# Patient Record
Sex: Male | Born: 1975 | Race: White | Hispanic: Yes | Marital: Married | Smoking: Never smoker
Health system: Southern US, Community
[De-identification: ages and names within clinical notes are randomized; demographics above are authoritative.]

## PROBLEM LIST (undated history)

## (undated) DIAGNOSIS — R059 Cough, unspecified: Secondary | ICD-10-CM

## (undated) DIAGNOSIS — F329 Major depressive disorder, single episode, unspecified: Secondary | ICD-10-CM

## (undated) DIAGNOSIS — R531 Weakness: Secondary | ICD-10-CM

## (undated) DIAGNOSIS — N289 Disorder of kidney and ureter, unspecified: Secondary | ICD-10-CM

## (undated) DIAGNOSIS — R55 Syncope and collapse: Secondary | ICD-10-CM

## (undated) DIAGNOSIS — F32A Depression, unspecified: Secondary | ICD-10-CM

## (undated) DIAGNOSIS — R6883 Chills (without fever): Secondary | ICD-10-CM

## (undated) DIAGNOSIS — R05 Cough: Secondary | ICD-10-CM

## (undated) DIAGNOSIS — R17 Unspecified jaundice: Secondary | ICD-10-CM

## (undated) DIAGNOSIS — I10 Essential (primary) hypertension: Secondary | ICD-10-CM

## (undated) DIAGNOSIS — E669 Obesity, unspecified: Secondary | ICD-10-CM

## (undated) DIAGNOSIS — Z5189 Encounter for other specified aftercare: Secondary | ICD-10-CM

## (undated) DIAGNOSIS — K219 Gastro-esophageal reflux disease without esophagitis: Secondary | ICD-10-CM

## (undated) DIAGNOSIS — R0602 Shortness of breath: Secondary | ICD-10-CM

## (undated) DIAGNOSIS — E569 Vitamin deficiency, unspecified: Secondary | ICD-10-CM

## (undated) DIAGNOSIS — D649 Anemia, unspecified: Secondary | ICD-10-CM

## (undated) DIAGNOSIS — E039 Hypothyroidism, unspecified: Secondary | ICD-10-CM

## (undated) DIAGNOSIS — R5383 Other fatigue: Secondary | ICD-10-CM

## (undated) DIAGNOSIS — R319 Hematuria, unspecified: Secondary | ICD-10-CM

## (undated) HISTORY — DX: Cough: R05

## (undated) HISTORY — DX: Hematuria, unspecified: R31.9

## (undated) HISTORY — DX: Weakness: R53.1

## (undated) HISTORY — DX: Depression, unspecified: F32.A

## (undated) HISTORY — DX: Anemia, unspecified: D64.9

## (undated) HISTORY — DX: Syncope and collapse: R55

## (undated) HISTORY — DX: Hypothyroidism, unspecified: E03.9

## (undated) HISTORY — DX: Other fatigue: R53.83

## (undated) HISTORY — DX: Chills (without fever): R68.83

## (undated) HISTORY — DX: Obesity, unspecified: E66.9

## (undated) HISTORY — DX: Cough, unspecified: R05.9

## (undated) HISTORY — DX: Major depressive disorder, single episode, unspecified: F32.9

## (undated) HISTORY — DX: Encounter for other specified aftercare: Z51.89

## (undated) HISTORY — DX: Essential (primary) hypertension: I10

---
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
## 2003-07-17 HISTORY — PX: CARDIAC CATHETERIZATION: SHX172

## 2006-08-28 ENCOUNTER — Ambulatory Visit: Payer: Self-pay | Admitting: Family Medicine

## 2006-08-28 LAB — CONVERTED CEMR LAB: Inflenza A Ag: NEGATIVE

## 2006-09-09 ENCOUNTER — Ambulatory Visit: Payer: Self-pay | Admitting: Family Medicine

## 2006-09-09 LAB — CONVERTED CEMR LAB: Inflenza A Ag: NEGATIVE

## 2006-11-12 ENCOUNTER — Encounter: Payer: Self-pay | Admitting: Family Medicine

## 2007-04-03 ENCOUNTER — Ambulatory Visit: Payer: Self-pay | Admitting: Family Medicine

## 2007-04-03 DIAGNOSIS — I10 Essential (primary) hypertension: Secondary | ICD-10-CM | POA: Insufficient documentation

## 2007-04-03 DIAGNOSIS — E119 Type 2 diabetes mellitus without complications: Secondary | ICD-10-CM | POA: Insufficient documentation

## 2007-06-26 ENCOUNTER — Ambulatory Visit: Payer: Self-pay | Admitting: Family Medicine

## 2007-11-03 ENCOUNTER — Encounter: Payer: Self-pay | Admitting: Family Medicine

## 2008-05-18 ENCOUNTER — Telehealth: Payer: Self-pay | Admitting: Family Medicine

## 2008-05-18 ENCOUNTER — Ambulatory Visit: Payer: Self-pay | Admitting: Family Medicine

## 2008-05-18 DIAGNOSIS — K7689 Other specified diseases of liver: Secondary | ICD-10-CM | POA: Insufficient documentation

## 2008-05-18 LAB — CONVERTED CEMR LAB
BUN: 7 mg/dL (ref 6–23)
Band Neutrophils: 4 % (ref 0–10)
Basophils Absolute: 0 10*3/uL (ref 0.0–0.1)
Basophils Relative: 0 % (ref 0–1)
Blood Glucose, Fasting: 120 mg/dL
CO2: 26 meq/L (ref 19–32)
Calcium: 9.6 mg/dL (ref 8.4–10.5)
Chloride: 107 meq/L (ref 96–112)
Creatinine, Ser: 0.9 mg/dL (ref 0.40–1.50)
Eosinophils Absolute: 0.2 10*3/uL (ref 0.0–0.7)
Eosinophils Relative: 3 % (ref 0–5)
Glucose, Bld: 129 mg/dL — ABNORMAL HIGH (ref 70–99)
HCT: 43.8 % (ref 39.0–52.0)
Hemoglobin: 14.4 g/dL (ref 13.0–17.0)
Lymphocytes Relative: 31 % (ref 12–46)
Lymphs Abs: 2 10*3/uL (ref 0.7–4.0)
MCHC: 33 g/dL (ref 30.0–36.0)
MCV: 93.3 fL (ref 78.0–100.0)
Monocytes Absolute: 0 10*3/uL — ABNORMAL LOW (ref 0.1–1.0)
Monocytes Relative: 0 % — ABNORMAL LOW (ref 3–12)
Neutro Abs: 4.4 10*3/uL (ref 1.7–7.7)
Neutrophils Relative %: 62 % (ref 43–77)
Platelets: 221 10*3/uL (ref 150–400)
Potassium: 3.9 meq/L (ref 3.5–5.3)
RBC: 4.69 M/uL (ref 4.22–5.81)
RDW: 12.4 % (ref 11.5–15.5)
Sodium: 138 meq/L (ref 135–145)
WBC: 6.6 10*3/uL (ref 4.0–10.5)

## 2008-05-19 LAB — CONVERTED CEMR LAB
ALT: 56 units/L — ABNORMAL HIGH (ref 0–53)
AST: 40 units/L — ABNORMAL HIGH (ref 0–37)
Albumin: 4.6 g/dL (ref 3.5–5.2)
Alkaline Phosphatase: 39 units/L (ref 39–117)
Amylase: 53 units/L (ref 27–131)
BUN: 8 mg/dL (ref 6–23)
CO2: 28 meq/L (ref 19–32)
Calcium: 9.8 mg/dL (ref 8.4–10.5)
Chloride: 105 meq/L (ref 96–112)
Cholesterol: 227 mg/dL — ABNORMAL HIGH (ref 0–200)
Creatinine, Ser: 1.1 mg/dL (ref 0.40–1.50)
Glucose, Bld: 125 mg/dL — ABNORMAL HIGH (ref 70–99)
HDL: 35 mg/dL — ABNORMAL LOW (ref >39)
LDL Cholesterol: 162 mg/dL — ABNORMAL HIGH (ref 0–99)
Lipase: 15 units/L (ref 11–59)
Potassium: 3.9 meq/L (ref 3.5–5.3)
Sodium: 140 meq/L (ref 135–145)
TSH: 3.796 microintl units/mL (ref 0.350–4.50)
Total Bilirubin: 1.3 mg/dL — ABNORMAL HIGH (ref 0.3–1.2)
Total CHOL/HDL Ratio: 6.5
Total Protein: 7.5 g/dL (ref 6.0–8.3)
Triglycerides: 151 mg/dL — ABNORMAL HIGH (ref <150)
VLDL: 30 mg/dL (ref 0–40)

## 2008-08-24 ENCOUNTER — Encounter: Payer: Self-pay | Admitting: Family Medicine

## 2009-08-10 ENCOUNTER — Ambulatory Visit: Payer: Self-pay | Admitting: Family Medicine

## 2009-08-10 LAB — CONVERTED CEMR LAB
Bilirubin Urine: NEGATIVE
Blood in Urine, dipstick: NEGATIVE
Creatinine,U: 300 mg/dL
Glucose, Urine, Semiquant: NEGATIVE
Microalbumin U total vol: 30 mg/L
Nitrite: NEGATIVE
Specific Gravity, Urine: 1.025
Urobilinogen, UA: 0.2
WBC Urine, dipstick: NEGATIVE
pH: 5.5

## 2009-08-11 LAB — CONVERTED CEMR LAB
ALT: 39 units/L (ref 0–53)
AST: 28 units/L (ref 0–37)
Albumin: 5.1 g/dL (ref 3.5–5.2)
Alkaline Phosphatase: 54 units/L (ref 39–117)
BUN: 18 mg/dL (ref 6–23)
CO2: 24 meq/L (ref 19–32)
Calcium: 9.9 mg/dL (ref 8.4–10.5)
Chloride: 102 meq/L (ref 96–112)
Creatinine, Ser: 0.81 mg/dL (ref 0.40–1.50)
Glucose, Bld: 120 mg/dL — ABNORMAL HIGH (ref 70–99)
Hgb A1c MFr Bld: 6.8 % — ABNORMAL HIGH (ref 4.6–6.1)
Potassium: 4.1 meq/L (ref 3.5–5.3)
Sodium: 139 meq/L (ref 135–145)
Total Bilirubin: 1 mg/dL (ref 0.3–1.2)
Total Protein: 8.1 g/dL (ref 6.0–8.3)

## 2009-11-28 ENCOUNTER — Ambulatory Visit: Payer: Self-pay | Admitting: Family Medicine

## 2009-11-28 DIAGNOSIS — F3289 Other specified depressive episodes: Secondary | ICD-10-CM | POA: Insufficient documentation

## 2009-11-28 DIAGNOSIS — E039 Hypothyroidism, unspecified: Secondary | ICD-10-CM | POA: Insufficient documentation

## 2009-11-28 DIAGNOSIS — F329 Major depressive disorder, single episode, unspecified: Secondary | ICD-10-CM

## 2009-11-28 DIAGNOSIS — E669 Obesity, unspecified: Secondary | ICD-10-CM | POA: Insufficient documentation

## 2009-11-28 DIAGNOSIS — L738 Other specified follicular disorders: Secondary | ICD-10-CM | POA: Insufficient documentation

## 2009-11-28 DIAGNOSIS — E038 Other specified hypothyroidism: Secondary | ICD-10-CM | POA: Insufficient documentation

## 2009-11-29 ENCOUNTER — Encounter: Payer: Self-pay | Admitting: Family Medicine

## 2009-11-29 LAB — CONVERTED CEMR LAB
Free T4: 0.98 ng/dL (ref 0.80–1.80)
Glucose, Bld: 177 mg/dL — ABNORMAL HIGH (ref 70–99)
T3, Free: 3.2 pg/mL (ref 2.3–4.2)
TSH: 10.366 microintl units/mL — ABNORMAL HIGH (ref 0.350–4.500)

## 2009-11-30 ENCOUNTER — Encounter: Admission: RE | Admit: 2009-11-30 | Discharge: 2009-11-30 | Payer: Self-pay | Admitting: Family Medicine

## 2009-11-30 DIAGNOSIS — E041 Nontoxic single thyroid nodule: Secondary | ICD-10-CM | POA: Insufficient documentation

## 2009-12-01 ENCOUNTER — Telehealth: Payer: Self-pay | Admitting: Family Medicine

## 2009-12-01 ENCOUNTER — Encounter: Payer: Self-pay | Admitting: Family Medicine

## 2009-12-07 ENCOUNTER — Encounter: Admission: RE | Admit: 2009-12-07 | Discharge: 2009-12-07 | Payer: Self-pay | Admitting: Family Medicine

## 2009-12-07 ENCOUNTER — Encounter: Payer: Self-pay | Admitting: Family Medicine

## 2009-12-07 ENCOUNTER — Other Ambulatory Visit: Admission: RE | Admit: 2009-12-07 | Discharge: 2009-12-07 | Payer: Self-pay | Admitting: Interventional Radiology

## 2009-12-09 ENCOUNTER — Telehealth: Payer: Self-pay | Admitting: Family Medicine

## 2009-12-20 ENCOUNTER — Telehealth: Payer: Self-pay | Admitting: Family Medicine

## 2010-01-06 ENCOUNTER — Ambulatory Visit: Payer: Self-pay | Admitting: Family Medicine

## 2010-01-06 LAB — CONVERTED CEMR LAB: Blood Glucose, Fingerstick: 135

## 2010-01-10 ENCOUNTER — Ambulatory Visit: Payer: Self-pay | Admitting: Family Medicine

## 2010-01-12 ENCOUNTER — Ambulatory Visit: Payer: Self-pay | Admitting: Family Medicine

## 2010-01-12 LAB — CONVERTED CEMR LAB
Bilirubin Urine: NEGATIVE
Blood in Urine, dipstick: NEGATIVE
Glucose, Urine, Semiquant: NEGATIVE
Ketones, urine, test strip: NEGATIVE
Nitrite: NEGATIVE
Protein, U semiquant: NEGATIVE
Specific Gravity, Urine: 1.025
Urobilinogen, UA: 0.2
WBC Urine, dipstick: NEGATIVE
pH: 5.5

## 2010-03-13 ENCOUNTER — Telehealth: Payer: Self-pay | Admitting: Family Medicine

## 2010-03-15 ENCOUNTER — Ambulatory Visit: Payer: Self-pay | Admitting: Family Medicine

## 2010-03-15 ENCOUNTER — Encounter: Admission: RE | Admit: 2010-03-15 | Discharge: 2010-03-15 | Payer: Self-pay | Admitting: Family Medicine

## 2010-03-15 LAB — CONVERTED CEMR LAB: Hgb A1c MFr Bld: 8.1 %

## 2010-03-23 ENCOUNTER — Ambulatory Visit: Payer: Self-pay | Admitting: Family Medicine

## 2010-04-06 ENCOUNTER — Encounter: Payer: Self-pay | Admitting: Family Medicine

## 2010-04-17 ENCOUNTER — Ambulatory Visit: Payer: Self-pay | Admitting: Family Medicine

## 2010-04-25 ENCOUNTER — Telehealth: Payer: Self-pay | Admitting: Family Medicine

## 2010-04-26 ENCOUNTER — Telehealth: Payer: Self-pay | Admitting: Family Medicine

## 2010-04-27 ENCOUNTER — Ambulatory Visit: Payer: Self-pay | Admitting: Family Medicine

## 2010-04-27 LAB — CONVERTED CEMR LAB
BUN: 16 mg/dL (ref 6–23)
Blood Glucose, AC Bkfst: 192 mg/dL
CO2: 26 meq/L (ref 19–32)
Calcium: 9.7 mg/dL (ref 8.4–10.5)
Chloride: 105 meq/L (ref 96–112)
Creatinine, Ser: 0.9 mg/dL (ref 0.40–1.50)
Glucose, Bld: 191 mg/dL — ABNORMAL HIGH (ref 70–99)
Potassium: 4 meq/L (ref 3.5–5.3)
Sodium: 135 meq/L (ref 135–145)

## 2010-05-12 ENCOUNTER — Encounter: Payer: Self-pay | Admitting: Family Medicine

## 2010-05-15 ENCOUNTER — Ambulatory Visit (HOSPITAL_COMMUNITY): Admission: RE | Admit: 2010-05-15 | Discharge: 2010-05-15 | Payer: Self-pay | Admitting: General Surgery

## 2010-05-15 LAB — HM DIABETES EYE EXAM

## 2010-05-18 ENCOUNTER — Encounter (INDEPENDENT_AMBULATORY_CARE_PROVIDER_SITE_OTHER): Payer: Self-pay | Admitting: *Deleted

## 2010-05-25 ENCOUNTER — Encounter
Admission: RE | Admit: 2010-05-25 | Discharge: 2010-08-15 | Payer: Self-pay | Source: Home / Self Care | Attending: General Surgery | Admitting: General Surgery

## 2010-07-16 HISTORY — PX: ROUX-EN-Y GASTRIC BYPASS: SHX1104

## 2010-07-24 ENCOUNTER — Inpatient Hospital Stay (HOSPITAL_COMMUNITY)
Admission: RE | Admit: 2010-07-24 | Discharge: 2010-07-30 | Payer: Self-pay | Source: Home / Self Care | Attending: General Surgery | Admitting: General Surgery

## 2010-07-25 ENCOUNTER — Encounter (INDEPENDENT_AMBULATORY_CARE_PROVIDER_SITE_OTHER): Payer: Self-pay | Admitting: General Surgery

## 2010-07-31 LAB — CROSSMATCH
ABO/RH(D): O POS
Antibody Screen: NEGATIVE
Unit division: 0
Unit division: 0
Unit division: 0
Unit division: 0
Unit division: 0
Unit division: 0
Unit division: 0
Unit division: 0

## 2010-07-31 LAB — CBC
HCT: 35.8 % — ABNORMAL LOW (ref 39.0–52.0)
HCT: 26.3 % — ABNORMAL LOW (ref 39.0–52.0)
HCT: 26.9 % — ABNORMAL LOW (ref 39.0–52.0)
HCT: 30 % — ABNORMAL LOW (ref 39.0–52.0)
HCT: 26.5 % — ABNORMAL LOW (ref 39.0–52.0)
HCT: 27 % — ABNORMAL LOW (ref 39.0–52.0)
HCT: 29 % — ABNORMAL LOW (ref 39.0–52.0)
Hemoglobin: 11.5 g/dL — ABNORMAL LOW (ref 13.0–17.0)
Hemoglobin: 8.8 g/dL — ABNORMAL LOW (ref 13.0–17.0)
Hemoglobin: 8.8 g/dL — ABNORMAL LOW (ref 13.0–17.0)
Hemoglobin: 10 g/dL — ABNORMAL LOW (ref 13.0–17.0)
Hemoglobin: 8.8 g/dL — ABNORMAL LOW (ref 13.0–17.0)
Hemoglobin: 8.9 g/dL — ABNORMAL LOW (ref 13.0–17.0)
Hemoglobin: 9.6 g/dL — ABNORMAL LOW (ref 13.0–17.0)
MCH: 30.4 pg (ref 26.0–34.0)
MCH: 29.9 pg (ref 26.0–34.0)
MCH: 29.6 pg (ref 26.0–34.0)
MCH: 29.9 pg (ref 26.0–34.0)
MCH: 29.9 pg (ref 26.0–34.0)
MCH: 29.7 pg (ref 26.0–34.0)
MCH: 29.8 pg (ref 26.0–34.0)
MCHC: 32.1 g/dL (ref 30.0–36.0)
MCHC: 33.5 g/dL (ref 30.0–36.0)
MCHC: 32.7 g/dL (ref 30.0–36.0)
MCHC: 33.3 g/dL (ref 30.0–36.0)
MCHC: 33.2 g/dL (ref 30.0–36.0)
MCHC: 33 g/dL (ref 30.0–36.0)
MCHC: 33.1 g/dL (ref 30.0–36.0)
MCV: 94.7 fL (ref 78.0–100.0)
MCV: 89.5 fL (ref 78.0–100.0)
MCV: 90.6 fL (ref 78.0–100.0)
MCV: 89.6 fL (ref 78.0–100.0)
MCV: 90.1 fL (ref 78.0–100.0)
MCV: 90 fL (ref 78.0–100.0)
MCV: 90.1 fL (ref 78.0–100.0)
Platelets: 144 10*3/uL — ABNORMAL LOW (ref 150–400)
Platelets: 85 10*3/uL — ABNORMAL LOW (ref 150–400)
Platelets: 76 10*3/uL — ABNORMAL LOW (ref 150–400)
Platelets: 119 10*3/uL — ABNORMAL LOW (ref 150–400)
Platelets: 119 10*3/uL — ABNORMAL LOW (ref 150–400)
Platelets: 171 10*3/uL (ref 150–400)
Platelets: 233 10*3/uL (ref 150–400)
RBC: 3.78 MIL/uL — ABNORMAL LOW (ref 4.22–5.81)
RBC: 2.94 MIL/uL — ABNORMAL LOW (ref 4.22–5.81)
RBC: 2.97 MIL/uL — ABNORMAL LOW (ref 4.22–5.81)
RBC: 3.35 MIL/uL — ABNORMAL LOW (ref 4.22–5.81)
RBC: 2.94 MIL/uL — ABNORMAL LOW (ref 4.22–5.81)
RBC: 3 MIL/uL — ABNORMAL LOW (ref 4.22–5.81)
RBC: 3.22 MIL/uL — ABNORMAL LOW (ref 4.22–5.81)
RDW: 12.7 % (ref 11.5–15.5)
RDW: 15.3 % (ref 11.5–15.5)
RDW: 15.1 % (ref 11.5–15.5)
RDW: 14.9 % (ref 11.5–15.5)
RDW: 14.9 % (ref 11.5–15.5)
RDW: 14.8 % (ref 11.5–15.5)
RDW: 14.9 % (ref 11.5–15.5)
WBC: 9.1 10*3/uL (ref 4.0–10.5)
WBC: 10.2 10*3/uL (ref 4.0–10.5)
WBC: 9.8 10*3/uL (ref 4.0–10.5)
WBC: 10.2 10*3/uL (ref 4.0–10.5)
WBC: 7.1 10*3/uL (ref 4.0–10.5)
WBC: 6.1 10*3/uL (ref 4.0–10.5)
WBC: 7 10*3/uL (ref 4.0–10.5)

## 2010-07-31 LAB — HEMOGLOBIN AND HEMATOCRIT, BLOOD
HCT: 43.7 % (ref 39.0–52.0)
HCT: 31.5 % — ABNORMAL LOW (ref 39.0–52.0)
HCT: 27 % — ABNORMAL LOW (ref 39.0–52.0)
HCT: 26.7 % — ABNORMAL LOW (ref 39.0–52.0)
HCT: 26 % — ABNORMAL LOW (ref 39.0–52.0)
HCT: 24.9 % — ABNORMAL LOW (ref 39.0–52.0)
HCT: 27.9 % — ABNORMAL LOW (ref 39.0–52.0)
HCT: 27.6 % — ABNORMAL LOW (ref 39.0–52.0)
Hemoglobin: 14.6 g/dL (ref 13.0–17.0)
Hemoglobin: 10.1 g/dL — ABNORMAL LOW (ref 13.0–17.0)
Hemoglobin: 8.6 g/dL — ABNORMAL LOW (ref 13.0–17.0)
Hemoglobin: 8.6 g/dL — ABNORMAL LOW (ref 13.0–17.0)
Hemoglobin: 8.5 g/dL — ABNORMAL LOW (ref 13.0–17.0)
Hemoglobin: 8.3 g/dL — ABNORMAL LOW (ref 13.0–17.0)
Hemoglobin: 9.3 g/dL — ABNORMAL LOW (ref 13.0–17.0)
Hemoglobin: 9.3 g/dL — ABNORMAL LOW (ref 13.0–17.0)

## 2010-07-31 LAB — GLUCOSE, CAPILLARY
Glucose-Capillary: 178 mg/dL — ABNORMAL HIGH (ref 70–99)
Glucose-Capillary: 191 mg/dL — ABNORMAL HIGH (ref 70–99)
Glucose-Capillary: 204 mg/dL — ABNORMAL HIGH (ref 70–99)
Glucose-Capillary: 221 mg/dL — ABNORMAL HIGH (ref 70–99)
Glucose-Capillary: 235 mg/dL — ABNORMAL HIGH (ref 70–99)
Glucose-Capillary: 252 mg/dL — ABNORMAL HIGH (ref 70–99)
Glucose-Capillary: 263 mg/dL — ABNORMAL HIGH (ref 70–99)
Glucose-Capillary: 117 mg/dL — ABNORMAL HIGH (ref 70–99)
Glucose-Capillary: 118 mg/dL — ABNORMAL HIGH (ref 70–99)
Glucose-Capillary: 119 mg/dL — ABNORMAL HIGH (ref 70–99)
Glucose-Capillary: 124 mg/dL — ABNORMAL HIGH (ref 70–99)
Glucose-Capillary: 127 mg/dL — ABNORMAL HIGH (ref 70–99)
Glucose-Capillary: 129 mg/dL — ABNORMAL HIGH (ref 70–99)
Glucose-Capillary: 138 mg/dL — ABNORMAL HIGH (ref 70–99)
Glucose-Capillary: 151 mg/dL — ABNORMAL HIGH (ref 70–99)
Glucose-Capillary: 168 mg/dL — ABNORMAL HIGH (ref 70–99)
Glucose-Capillary: 179 mg/dL — ABNORMAL HIGH (ref 70–99)
Glucose-Capillary: 219 mg/dL — ABNORMAL HIGH (ref 70–99)
Glucose-Capillary: 227 mg/dL — ABNORMAL HIGH (ref 70–99)
Glucose-Capillary: 100 mg/dL — ABNORMAL HIGH (ref 70–99)
Glucose-Capillary: 100 mg/dL — ABNORMAL HIGH (ref 70–99)
Glucose-Capillary: 115 mg/dL — ABNORMAL HIGH (ref 70–99)
Glucose-Capillary: 124 mg/dL — ABNORMAL HIGH (ref 70–99)
Glucose-Capillary: 128 mg/dL — ABNORMAL HIGH (ref 70–99)
Glucose-Capillary: 129 mg/dL — ABNORMAL HIGH (ref 70–99)
Glucose-Capillary: 138 mg/dL — ABNORMAL HIGH (ref 70–99)
Glucose-Capillary: 139 mg/dL — ABNORMAL HIGH (ref 70–99)
Glucose-Capillary: 140 mg/dL — ABNORMAL HIGH (ref 70–99)
Glucose-Capillary: 198 mg/dL — ABNORMAL HIGH (ref 70–99)
Glucose-Capillary: 211 mg/dL — ABNORMAL HIGH (ref 70–99)
Glucose-Capillary: 286 mg/dL — ABNORMAL HIGH (ref 70–99)
Glucose-Capillary: 90 mg/dL (ref 70–99)
Glucose-Capillary: 97 mg/dL (ref 70–99)
Glucose-Capillary: 99 mg/dL (ref 70–99)
Glucose-Capillary: 102 mg/dL — ABNORMAL HIGH (ref 70–99)
Glucose-Capillary: 106 mg/dL — ABNORMAL HIGH (ref 70–99)
Glucose-Capillary: 120 mg/dL — ABNORMAL HIGH (ref 70–99)

## 2010-07-31 LAB — BASIC METABOLIC PANEL
BUN: 11 mg/dL (ref 6–23)
CO2: 29 mEq/L (ref 19–32)
Calcium: 7.9 mg/dL — ABNORMAL LOW (ref 8.4–10.5)
Chloride: 107 mEq/L (ref 96–112)
Creatinine, Ser: 0.98 mg/dL (ref 0.4–1.5)
GFR calc Af Amer: >60 mL/min (ref >60)
GFR calc non Af Amer: >60 mL/min (ref >60)
Glucose, Bld: 200 mg/dL — ABNORMAL HIGH (ref 70–99)
Potassium: 4.1 mEq/L (ref 3.5–5.1)
Sodium: 140 mEq/L (ref 135–145)

## 2010-07-31 LAB — PROTIME-INR
INR: 1.17 (ref 0.00–1.49)
Prothrombin Time: 15.1 seconds (ref 11.6–15.2)

## 2010-07-31 LAB — DIFFERENTIAL
Basophils Absolute: 0 10*3/uL (ref 0.0–0.1)
Basophils Absolute: 0 10*3/uL (ref 0.0–0.1)
Basophils Relative: 0 % (ref 0–1)
Basophils Relative: 0 % (ref 0–1)
Eosinophils Absolute: 0 10*3/uL (ref 0.0–0.7)
Eosinophils Absolute: 0 10*3/uL (ref 0.0–0.7)
Eosinophils Relative: 0 % (ref 0–5)
Eosinophils Relative: 0 % (ref 0–5)
Lymphocytes Relative: 14 % (ref 12–46)
Lymphocytes Relative: 18 % (ref 12–46)
Lymphs Abs: 1.3 10*3/uL (ref 0.7–4.0)
Lymphs Abs: 1.9 10*3/uL (ref 0.7–4.0)
Monocytes Absolute: 0.7 10*3/uL (ref 0.1–1.0)
Monocytes Absolute: 1.1 10*3/uL — ABNORMAL HIGH (ref 0.1–1.0)
Monocytes Relative: 8 % (ref 3–12)
Monocytes Relative: 11 % (ref 3–12)
Neutro Abs: 7.1 10*3/uL (ref 1.7–7.7)
Neutro Abs: 7.2 10*3/uL (ref 1.7–7.7)
Neutrophils Relative %: 78 % — ABNORMAL HIGH (ref 43–77)
Neutrophils Relative %: 71 % (ref 43–77)

## 2010-07-31 LAB — PREPARE PLATELETS: Unit division: 0

## 2010-07-31 LAB — ABO/RH: ABO/RH(D): O POS

## 2010-07-31 LAB — PREPARE FRESH FROZEN PLASMA: Unit division: 0

## 2010-07-31 LAB — PREPARE RBC (CROSSMATCH)

## 2010-07-31 LAB — APTT: aPTT: 27 seconds (ref 24–37)

## 2010-08-02 ENCOUNTER — Ambulatory Visit
Admission: RE | Admit: 2010-08-02 | Discharge: 2010-08-02 | Payer: Self-pay | Source: Home / Self Care | Attending: Family Medicine | Admitting: Family Medicine

## 2010-08-02 DIAGNOSIS — Z9884 Bariatric surgery status: Secondary | ICD-10-CM | POA: Insufficient documentation

## 2010-08-02 DIAGNOSIS — D649 Anemia, unspecified: Secondary | ICD-10-CM | POA: Insufficient documentation

## 2010-08-02 LAB — CONVERTED CEMR LAB
Blood Glucose, Fingerstick: 131
WBC: 11 10*3/uL

## 2010-08-03 ENCOUNTER — Encounter: Payer: Self-pay | Admitting: Family Medicine

## 2010-08-03 LAB — CONVERTED CEMR LAB
AST: 60 units/L — ABNORMAL HIGH (ref 0–37)
Alkaline Phosphatase: 61 units/L (ref 39–117)
BUN: 11 mg/dL (ref 6–23)
Basophils Relative: 0 % (ref 0–1)
Calcium: 9.6 mg/dL (ref 8.4–10.5)
Chloride: 103 meq/L (ref 96–112)
Creatinine, Ser: 0.85 mg/dL (ref 0.40–1.50)
Eosinophils Absolute: 0.2 10*3/uL (ref 0.0–0.7)
Ferritin: 247 ng/mL (ref 22–322)
Glucose, Bld: 113 mg/dL — ABNORMAL HIGH (ref 70–99)
Hemoglobin: 11.3 g/dL — ABNORMAL LOW (ref 13.0–17.0)
MCHC: 33 g/dL (ref 30.0–36.0)
MCV: 91.2 fL (ref 78.0–100.0)
Monocytes Absolute: 0.5 10*3/uL (ref 0.1–1.0)
Monocytes Relative: 8 % (ref 3–12)
RBC: 3.75 M/uL — ABNORMAL LOW (ref 4.22–5.81)

## 2010-08-04 NOTE — Discharge Summary (Signed)
Casey Fletcher          ACCOUNT NO.:  [id number]  MEDICAL RECORD NO.:  [id number]          PATIENT TYPE:  INP  LOCATION:  1539                         FACILITY:  [facility]  PHYSICIAN:  Sharlet Salina T. Willard Madrigal, M.D.DATE OF BIRTH:  10-Aug-1975  DATE OF ADMISSION:  07/24/2010 DATE OF DISCHARGE:  07/30/2010                              DISCHARGE SUMMARY   DISCHARGE DIAGNOSES: 1. Morbid obesity. 2. Postoperative gastrointestinal bleed.  OPERATIONS AND PROCEDURES:  Laparoscopic Roux-en-Y gastric bypass, Dr. Johna Sheriff, on July 24, 2010.  HISTORY OF PRESENT ILLNESS:  Casey Fletcher is a 35 year old male with longstanding history of progressive morbid obesity, unresponsive to medical management.  He presents at a weight of 370 pounds, BMI of 45 and comorbidities of poorly-controlled insulin-dependent diabetes mellitus, hypertension, elevated cholesterol.  After extensive preoperative workup and discussion as an outpatient, he is admitted electively for laparoscopic Roux-en-Y gastric bypass.  PAST MEDICAL HISTORY:  Other surgery includes appendectomy, negative percutaneous biopsy of thyroid.  MEDICATIONS ON ADMISSION:  Lantus 20 daily, metformin 1000 twice daily, Synthroid 0.05 daily, ramipril 2.5 daily.  ALLERGIES:  None.  SOCIAL HISTORY:  See detailed H and P.  FAMILY HISTORY:  See detailed H and P.  REVIEW OF SYSTEMS:  See detailed H and P.  PERTINENT PHYSICAL EXAMINATION:  Significant only for morbid obesity.  HOSPITAL COURSE:  The patient was admitted on the morning of this procedure and underwent an unremarkable laparoscopic Roux-en-Y gastric bypass.  On the first morning postoperatively, however, he had some mild tachycardia of about 100 and when he went for his barium swallow, was dizzy and lightheaded and this had to be cancelled.  Immediately after that, he had a bloody bowel movement, heart rate was 117 and hemoglobin was 11.5 down from 14.1 preop.  At this  point, the patient was transferred to the step-down unit for observation and his Lovenox was held.  Later that evening, he had one further large bloody bowel movement and heart rate went up to 140s briefly, blood pressure was somewhat low at 106/60.  This improved with fluid bolus with heart rate back down to 120s but hemoglobin throughout the day went 10.1 and then 8.6.  He was transfused 4 units of packed cells overnight on July 25, 2010, and July 26, 2010, with stabilization of his hemoglobin at 8.8 but no rise.  His vital signs remained relatively stable with normal blood pressure and heart rate of about 106.  He then received fresh frozen plasma and platelets subsequently.  On July 27, 2010, he was feeling well, heart rate was down to 95 but hemoglobin dropped somewhat to 8.3.  He received subsequently 2 more units of packed cells.  By July 28, 2010, he was feeling much better, heart rate was down to 95, hemoglobin which had gone up to 10 after the 6 units was down to 8.8 but there was no evidence of bleeding or vital sign instability and he was observed.  On July 29, 2010, his hemoglobin was stable at 8.9.  He felt well.  He was tolerating protein shakes.  He never had any abdominal pain.  By July 30, 2010, he had normal bowel  movement without blood.  Hemoglobin was increased to 9.6.  White count was 7000, and he was nontender.  Wound was clean.  His heart rate was 85, blood pressure 132/70.  At this point, he was doing well and was discharged home.  His staples were removed.  DISCHARGE MEDICATIONS:  Same as admission, although he is on a reduced dose of insulin.  FOLLOWUP:  Followup is to be in my office in 10 days.     Casey Fletcher, M.D.     Casey Fletcher  D:  07/30/2010  T:  07/30/2010  Job:  [id number]  Electronically Signed by Glenna Fellows M.D. on 08/04/2010 09:15:21 AM

## 2010-08-08 ENCOUNTER — Encounter
Admission: RE | Admit: 2010-08-08 | Discharge: 2010-08-15 | Payer: Self-pay | Source: Home / Self Care | Attending: General Surgery | Admitting: General Surgery

## 2010-08-10 ENCOUNTER — Encounter: Payer: Self-pay | Admitting: Family Medicine

## 2010-08-15 NOTE — Letter (Signed)
Summary: Depression Questionnaire  Depression Questionnaire   Imported By: Lanelle Bal 12/05/2009 11:30:59  _____________________________________________________________________  External Attachment:    Type:   Image     Comment:   External Document

## 2010-08-15 NOTE — Progress Notes (Signed)
Summary: Work note  Phone Note Call from Patient   Caller: [phone] Summary of Call: Pt called stating he is still not feeling well. He is at work right now but is feeling very run down and tired. pt would like to know if he goes home, will you write him a note. Please advise. Initial call taken by: Payton Spark CMA,  Dec 01, 2009 9:57 AM  Follow-up for Phone Call        yes, will write him out today. Follow-up by: Seymour Bars DO,  Dec 01, 2009 10:24 AM     Appended Document: Work note 12/01/2009 @ 10:31am- Pt notified and faxed note to his work at [phone]. KJ LPN

## 2010-08-15 NOTE — Assessment & Plan Note (Signed)
Summary: DM, HTN   Vital Signs:  Patient profile:   35 year old male Height:      75.5 inches Weight:      355 pounds BMI:     43.94 Pulse rate:   101 / minute BP sitting:   158 / 102  (left arm) Cuff size:   large  Vitals Entered By: Kathlene November (August 10, 2009 9:56 AM) CC: physical and papers filled out, Hypertension Management  Vision Screening:Left eye with correction: 20 / 20 Right eye with correction: 20 / 20 Both eyes with correction: 20 / 20  Color vision testing: normal      Vision Entered By: Kathlene November (August 10, 2009 9:57 AM) 25db HL: Left  500 hz: 25db 1000 hz: 25db 2000 hz: 25db 4000 hz: 25db Right  500 hz: 25db 1000 hz: 25db 2000 hz: 25db 4000 hz: 25db    Primary Care Provider:  Nani Gasser MD  CC:  physical and papers filled out and Hypertension Management.  History of Present Illness: Had lost 150lb and his endocrinologist took him off his meds but now has gained about 70 lbs back. Admits he is a stress eater.  Say he has started back on his weight loss plan and regular exercise. He is also on Adipex through a bariatric clinic. 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 He brought hsi forms in with him.   Diabetes Management History:      The patient is a 35 years old male who comes in for evaluation of DM Type 2.  He says that he is not exercising regularly.        Hypoglycemic symptoms are not occurring.  No hyperglycemic symptoms are reported.  Other comments include: Has not bee checking his sugars. .        There are no symptoms to suggest diabetic complications.  No changes have been made to his treatment plan since last visit.    Hypertension History:      He denies headache, chest pain, palpitations, dyspnea with exertion, orthopnea, PND, peripheral edema, visual symptoms, neurologic problems, syncope, and side effects from treatment.  He notes no problems with any antihypertensive medication  side effects.        Positive major cardiovascular risk factors include diabetes and hypertension.  Negative major cardiovascular risk factors include male age less than 33 years old and non-tobacco-user status.     Current Medications (verified): 1)  Adipex-P 37.5 Mg Tabs (Phentermine Hcl) .... Take 1 1/2 Tablets By Mouth Once A Day  Allergies (verified): No Known Drug Allergies  Comments:  Nurse/Medical Assistant: The patient's medications and allergies were reviewed with the patient and were updated in the Medication and Allergy Lists. Kathlene November (August 10, 2009 9:58 AM)  Physical Exam  General:  Well-developed,well-nourished,in no acute distress; alert,appropriate and cooperative throughout examination. Obese.  Head:  Normocephalic and atraumatic without obvious abnormalities. No apparent alopecia or balding. Lungs:  Normal respiratory effort, chest expands symmetrically. Lungs are clear to auscultation, no crackles or wheezes. Heart:  Normal rate and regular rhythm. S1 and S2 normal without gallop, murmur, click, rub or other extra sounds. Pulses:  Radial 2+  Extremities:  No LE edmea.  Skin:  no rashes.   Psych:  Cognition and judgment appear intact. Alert and cooperative with normal attention span and concentration. No apparent delusions, illusions, hallucinations   Impression & Recommendations:  Problem # 1:  DIABETES MELLITUS, TYPE II,  CONTROLLED (ICD-250.00) Assessment Deteriorated Discussed that since our maching wouldn't read the A1C will get a serum test to confirm. He is not spilling glucose in his urine which is a good sign. 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 So I gave him his forms back and we didn't complete these.  He really wants to work on his weight and then fu in 6 weeks to recheck his Diabetes and BP. Neg microalbumin today.  The following medications were removed from  the medication list:    Janumet 50-1000 Mg Tabs (Sitagliptin-metformin hcl) ..... One by mouth two times a day  Orders: Fingerstick (36416) Hgb A1C (29562ZH) T-Comprehensive Metabolic Panel (08657-84696) T- * Misc. Laboratory test [phone]) UA Dipstick w/o Micro (automated)  (81003) Urine Microalbumin (41324) Creatinine  (82570)  Problem # 2:  HYPERTENSION, BENIGN ESSENTIAL (ICD-401.1) Assessment: Deteriorated Discussed starting a BP medication for now and at fu in 6 weeks if has lost weight and BP looks good then consider d/c again.   His updated medication list for this problem includes:    Hydrochlorothiazide 25 Mg Tabs (Hydrochlorothiazide) .Marland Kitchen... Take 1 tablet by mouth once a day  Complete Medication List: 1)  Hydrochlorothiazide 25 Mg Tabs (Hydrochlorothiazide) .... Take 1 tablet by mouth once a day 2)  Adipex-p 37.5 Mg Tabs (Phentermine hcl) .... Take 1 1/2 tablets by mouth once a day  Diabetes Management Assessment/Plan:      His blood pressure goal is < 130/80.    Hypertension Assessment/Plan:      The patient's hypertensive risk group is category C: Target organ damage and/or diabetes.  His calculated 10 year risk of coronary heart disease is 18 %.  Today's blood pressure is 158/102.  His blood pressure goal is < 130/80.  Patient Instructions: 1)  Please schedule a follow-up appointment in 6 weeks. Work on weight loss. 2)  Restart BP pill and we will call you with your diabetes check tomorrow and let you know if we need to restart a diabetic med.  Prescriptions: HYDROCHLOROTHIAZIDE 25 MG  TABS (HYDROCHLOROTHIAZIDE) Take 1 tablet by mouth once a day  #30 x 2   Entered and Authorized by:   Nani Gasser MD   Signed by:   Nani Gasser MD on 08/10/2009   Method used:   Electronically to        [facility] [phone]* (retail)       [address]       [place], [place]  [address]       Ph: [phone]       Fax: [phone]   RxID:    [id number]   Laboratory Results   Urine Tests  Date/Time Received: 08/10/2009 Date/Time Reported: 08/10/2009  Routine Urinalysis   Color: yellow Appearance: Clear Glucose: negative   (Normal Range: Negative) Bilirubin: negative   (Normal Range: Negative) Ketone: trace (5)   (Normal Range: Negative) Spec. Gravity: 1.025   (Normal Range: 1.003-1.035) Blood: negative   (Normal Range: Negative) pH: 5.5   (Normal Range: 5.0-8.0) Protein: trace   (Normal Range: Negative) Urobilinogen: 0.2   (Normal Range: 0-1) Nitrite: negative   (Normal Range: Negative) Leukocyte Esterace: negative   (Normal Range: Negative)  Microalbumin (urine): 30 mg/L Creatinine: 300mg /dL  A:C Ratio 30mg /g  Blood Tests   Date/Time Received: 08/10/2009 Date/Time Reported: 08/10/2009

## 2010-08-15 NOTE — Miscellaneous (Signed)
Summary: No retinopathy  Clinical Lists Changes  Observations: Added new observation of DIAB EYE EX: No retinopathy- Dr. Sherrie George (05/15/2010 9:49)

## 2010-08-15 NOTE — Assessment & Plan Note (Signed)
Summary: f/u abd pain   Vital Signs:  Patient profile:   35 year old male Height:      75.5 inches Weight:      370 pounds BMI:     45.80 O2 Sat:      96 % on Room air Temp:     98.4 degrees F oral Pulse rate:   105 / minute BP sitting:   152 / 84  (left arm) Cuff size:   large  Vitals Entered By: Payton Spark CMA (March 23, 2010 2:00 PM)  O2 Flow:  Room air CC: F/U. Feeling much better. Not taking levothyroxine.    Primary Care Provider:  Seymour Bars DO  CC:  F/U. Feeling much better. Not taking levothyroxine. Casey Fletcher  History of Present Illness: 35 yo HM presents for f/u mesenteric lymphadenitis.  He is feeling much better after 7 days of Cipro.  Denies F/C.  Denies N/V/D.  He is back on Janumet.  He stopped the Synthroid because it made him sleepy.    His energy level and appetite have improved.  He only needed 3 doses of Humalog for SSI.  His fasting sugars are around 160.  He is upset about his weight gain.  He lost 127 lbs about a year ago and has already regained 100 lbs.  He was on wt loss pills at the time.  He reports being overweight since childhood.   He has always had a  big appetite.      Current Medications (verified): 1)  Levothyroxine Sodium 25 Mcg Tabs (Levothyroxine Sodium) .Casey Fletcher.. 1 Tab By Mouth Daily 2)  Janumet 50-1000 Mg Tabs (Sitagliptin-Metformin Hcl) .... Take 1 Tablet By Mouth Two Times A Day 3)  Humalog Kwikpen 100 Unit/ml Soln (Insulin Lispro (Human)) .... Use For Sliding Scale Insulin 4)  Bd Ultrafine Pen Needles 31 G .... Use As Directed  Allergies (verified): No Known Drug Allergies  Past History:  Past Medical History: Reviewed history from 03/15/2010 and no changes required. Cardiac Cath 2005 obesity Hypothyroidism - off meds DM, diet - controlled  endo: HP.  Past Surgical History: Reviewed history from 08/28/2006 and no changes required. Appendix 1988  Family History: Reviewed history from 08/28/2006 and no changes  required. Mother and Father with cholestrol an d HTN  Social History: Reviewed history from 08/28/2006 and no changes required. Works at [employer] and at [employer].  Had 17 years of education.  Married to [employer]. Never Smoked Alcohol use-no Drug use-no Regular exercise-no  Review of Systems      See HPI  Physical Exam  General:  obese HM in NAD Head:  normocephalic and atraumatic.   Eyes:  sclera non icteric Mouth:  pharynx pink and moist.   Lungs:  Normal respiratory effort, chest expands symmetrically. Lungs are clear to auscultation, no crackles or wheezes. Heart:  Normal rate and regular rhythm. S1 and S2 normal without gallop, murmur, click, rub or other extra sounds. Abdomen:  Bowel sounds positive,abdomen soft and non-tender without masses, organomegaly Extremities:  no LE edema Skin:  color normal.   Cervical Nodes:  No lymphadenopathy noted Psych:  good eye contact, not anxious appearing, and not depressed appearing.     Impression & Recommendations:  Problem # 1:  LUQ PAIN (ICD-789.02) Assessment Improved REsolved after 7 days of Cipro.   Mesenteric Lymphadenitis on CT scan.   Problem # 2:  DIABETES MELLITUS, TYPE II, CONTROLLED (ICD-250.00) Sugars have improved some since treatment of infection.  Given  wt gain and A1C of 8.1, will change Janumet to plain metformin and start Victoza daily.  Call if any problems.  RTC in 1 month. The following medications were removed from the medication list:    Humalog Kwikpen 100 Unit/ml Soln (Insulin lispro (human)) ..... Use for sliding scale insulin His updated medication list for this problem includes:    Metformin Hcl 1000 Mg Tabs (Metformin hcl) .Casey Fletcher... 1 tab by mouth two times a day    Victoza 18 Mg/40ml Soln (Liraglutide) .Casey Fletcher... 0.6 mg [place] injection once daily x 1 wk then increase to 1.2 mg [place] injection once daily    Ramipril 2.5 Mg Caps (Ramipril) .Casey Fletcher... 1 capsule by mouth daily  Problem # 3:  HYPERTENSION, BENIGN  ESSENTIAL (ICD-401.1) Start Ramipril daily for HTN, DM. The following medications were removed from the medication list:    Atenolol 50 Mg Tabs (Atenolol) .Casey Fletcher... 1 tab by mouth daily His updated medication list for this problem includes:    Ramipril 2.5 Mg Caps (Ramipril) .Casey Fletcher... 1 capsule by mouth daily  BP today: 152/84 Prior BP: 127/84 (03/15/2010)  Prior 10 Yr Risk Heart Disease: 18 % (08/10/2009)  Labs Reviewed: K+: 4.1 (08/10/2009) Creat: : 0.81 (08/10/2009)   Chol: 227 (05/18/2008)   HDL: 35 (05/18/2008)   LDL: 162 (05/18/2008)   TG: 151 (05/18/2008)  Problem # 4:  OBESITY, UNSPECIFIED (ICD-278.00) BMI 45 c/w class III obesity, complicated by T2DM, HTN and NAFLD. Casey Fletcher - yo dieter with little exercise and large appetite.  Has regained the wt after use of wt loss pills. Trial of Victoza for satiety/ sugars for now. Info given on [facility] Bariatric surgery class. Encouraged him to attend.    Complete Medication List: 1)  Levothyroxine Sodium 25 Mcg Tabs (Levothyroxine sodium) .Casey Fletcher.. 1 tab by mouth qpm 2)  Metformin Hcl 1000 Mg Tabs (Metformin hcl) .Casey Fletcher.. 1 tab by mouth two times a day 3)  Bd Ultrafine Pen Needles 31 G  .... Use as directed 4)  Victoza 18 Mg/17ml Soln (Liraglutide) .... 0.6 mg [place] injection once daily x 1 wk then increase to 1.2 mg [place] injection once daily 5)  Ramipril 2.5 Mg Caps (Ramipril) .Casey Fletcher.. 1 capsule by mouth daily  Patient Instructions: 1)  Change Janumet to plain metformin 1000 mg two times a day. 2)  Move Levothyroxine to nighttime. 3)  Recheck TSH in 4 wks. 4)  Info given on Bariatric surgery classes. 5)  Let me know if you need anything. 6)  Add Ramipril once daily for high BP. 7)  Add Victoza once daily injection (inject with breakfast).  Start with 0.6 mg/ day x 1 wk then go up to 1.2 mg/ day. 8)  REturn for f/u in 1 month. Prescriptions: RAMIPRIL 2.5 MG CAPS (RAMIPRIL) 1 capsule by mouth daily  #30 x 2   Entered and Authorized by:   Seymour Bars DO   Signed  by:   Seymour Bars DO on 03/23/2010   Method used:   Electronically to        [facility] [phone]* (retail)       [address] [place], [place]  [address]       Ph: [phone]       Fax: [phone]   RxID:   [id number] METFORMIN HCL 1000 MG TABS (METFORMIN HCL) 1 tab by mouth two times a day  #60 x 3   Entered and Authorized by:   Seymour Bars DO  Signed by:   Seymour Bars DO on 03/23/2010   Method used:   Electronically to        [facility] [phone]* (retail)       [address] [place], [place]  [address]       Ph: [phone]       Fax: [phone]   RxID:   [id number] LEVOTHYROXINE SODIUM 25 MCG TABS (LEVOTHYROXINE SODIUM) 1 tab by mouth qPM  #30 x 3   Entered and Authorized by:   Seymour Bars DO   Signed by:   Seymour Bars DO on 03/23/2010   Method used:   Electronically to        [facility] [phone]* (retail)       [address]       [place], [place]  [address]       Ph: [phone]       Fax: [phone]   RxID:   [id number]

## 2010-08-15 NOTE — Progress Notes (Signed)
Summary: referral to Endocrinology  Phone Note Outgoing Call   Call placed by: Michaelle Copas Call placed to: Patient Summary of Call: I called [facility] at [phone] and spoke to [facility] and scheduled pt appt for 02/07/10 Tuesday, check in at 2:10 for a 2:40 appt.with Dr. Allena Katz, Located at [address] [place], [place] [address]... I called and spoke to the pt and the wife, and they both wrote down all the above appt info. Thanks, Michaelle Copas Initial call taken by: Michaelle Copas,  December 20, 2009 1:42 PM

## 2010-08-15 NOTE — Letter (Signed)
Summary: Generic Letter  [facility]  [address], [address]   [place], [place] [address]   Phone: [phone]  Fax: [phone]    04/06/2010  Casey Fletcher [address] [place], [place]  [address]  To Whom It May Concern,  Casey Fletcher is a 35 year old hispanic primary care patient of mine with years of morbid obesity.  He has co-morbidities of Type II Diabetes, Hyperlipidemia, Hypertension and Non-alcoholic fatty liver disease.    He has lost weight on appetite suppressants in the past but this has been short - lived with weight re-gain after each attempt.  He has been counseled on appropriate diet and goals for exercercise, however, he has failed to maintain weight loss.  I feel that Casey Fletcher would be an excellent candidate for bariatric surgery and I fully support his decision to do this.  I plan to follow his course post-operatively.    Thank You.        Sincerely,    Seymour Bars DO

## 2010-08-15 NOTE — Assessment & Plan Note (Signed)
Summary: work physical/ EKG   Vital Signs:  Patient profile:   35 year old male Height:      75.5 inches Weight:      352 pounds BMI:     43.57 O2 Sat:      98 % on Room air Pulse rate:   120 / minute BP sitting:   147 / 98  (left arm) Cuff size:   large  Vitals Entered By: Payton Spark CMA (January 06, 2010 1:51 PM)  O2 Flow:  Room air  Serial Vital Signs/Assessments:  Time      Position  BP       Pulse  Resp  Temp     By                     155/91   122                   Payton Spark CMA  CC: CPE CBG Result 135   Primary Care Provider:  Nani Gasser MD  CC:  CPE.  History of Present Illness: 35 yo HM presents for an employment physical.  He is doing well.  He has been on Adipex thru the [facility].  He is exercising.  Denies any CP or SOB.  Denies hx of any heart problems.  he is not a smoker.     He has been working on [employer] and exercise. His immunizations are UTD.    Current Medications (verified): 1)  Budeprion Xl 150 Mg Xr24h-Tab (Bupropion Hcl) .Marland Kitchen.. 1 Tab By Mouth Once A Day 2)  Clindamycin Phosphate 1 % Gel (Clindamycin Phosphate) .... Use On Rash Two Times A Day 3)  Levothyroxine Sodium 25 Mcg Tabs (Levothyroxine Sodium) .Marland Kitchen.. 1 Tab By Mouth Daily  Allergies (verified): No Known Drug Allergies  Past History:  Past Medical History: Reviewed history from 11/28/2009 and no changes required. Cardiac Cath 2005 obesity Hypothyroidism - off meds DM, diet - controlled  Past Surgical History: Reviewed history from 08/28/2006 and no changes required. Appendix 1988  Social History: Reviewed history from 08/28/2006 and no changes required. Works at [employer] and at [employer].  Had 17 years of education.  Married to [employer]. Never Smoked Alcohol use-no Drug use-no Regular exercise-no  Review of Systems      See HPI  Physical Exam  General:  alert, well-developed, well-nourished, and well-hydrated.  obese Head:  normocephalic  and atraumatic.   Eyes:  wears glasses;  Ears:  EACs patent; TMs translucent and gray with good cone of light and bony landmarks.  Nose:  no nasal discharge.   Mouth:  pharynx pink and moist.   Neck:  full ROM and no masses.   Lungs:  Normal respiratory effort, chest expands symmetrically. Lungs are clear to auscultation, no crackles or wheezes. Heart:  no murmur and tachycardia.   Abdomen:  Bowel sounds positive,abdomen soft and non-tender without masses, organomegaly  Msk:  No deformity or scoliosis noted of thoracic or lumbar spine.   Pulses:  2+ radial and PT pulses Extremities:  no LE edema Neurologic:  gait normal and DTRs symmetrical and normal.   Skin:  color normal, no rashes, and no suspicious lesions.   Cervical Nodes:  No lymphadenopathy noted Psych:  good eye contact, not anxious appearing, and not depressed appearing.     Impression & Recommendations:  Problem # 1:  OTH GENERAL MEDICAL EXAMINATION ADMIN PURPOSES (ICD-V70.3) [place] forms completed. BP /  HR rechecked.   BMI high -- working on wt loss. EKG done for HR >100.  sinus tachycardia with HR 103, possible ectopic atrial tachycardia.   Glucose: 135 random. Tdap done 06. Update FLP. Stop ADIPEX, RTC on Tues for UA, recheck BP /HR and form completion.    Complete Medication List: 1)  Levothyroxine Sodium 25 Mcg Tabs (Levothyroxine sodium) .Marland Kitchen.. 1 tab by mouth daily  Other Orders: T-Lipid Profile ([address]64) EKG w/ Interpretation (93000) Fingerstick (36416) Glucose, (CBG) (40347)  Laboratory Results   Blood Tests     CBG Random:: 135mg /dL

## 2010-08-15 NOTE — Progress Notes (Signed)
Summary: High sugar, nausea and vomiting  Phone Note Call from Patient   Caller: Spouse [phone] ex [phone] Summary of Call: Wife calls stating Pt is at home w/ nausea and vomiting. Pt's fasting sugar was 260 and after taking medications he sugar is now 240. Wife would like to know what Pt should do. Please advise. Initial call taken by: Payton Spark CMA,  April 26, 2010 9:53 AM  Follow-up for Phone Call        I reviewed his labs/ ED notes from 04-24-2010.  His bicarb and blood counts were normal.  I am going to treat his nausea with generic zofran (sent to the pharmacy).  Work on pushing intake of WATER.  Check sugars 3 x a day.  Use Novolog for sliding scale if needed.  Let's also increase Lantus to 30 units once a day.  Schedule OV for tomorrow. Follow-up by: Seymour Bars DO,  April 26, 2010 10:18 AM    New/Updated Medications: LANTUS SOLOSTAR 100 UNIT/ML SOLN (INSULIN GLARGINE) 30 units [place] at bedtime each day ONDANSETRON HCL 8 MG TABS (ONDANSETRON HCL) 1 tab by mouth three times a day as needed nausea Prescriptions: ONDANSETRON HCL 8 MG TABS (ONDANSETRON HCL) 1 tab by mouth three times a day as needed nausea  #20 x 0   Entered and Authorized by:   Seymour Bars DO   Signed by:   Seymour Bars DO on 04/26/2010   Method used:   Electronically to        [facility] [phone]* (retail)       [address]       [place], [place]  [address]       Ph: [phone]       Fax: [phone]   RxID:   [id number]   Appended Document: High sugar, nausea and vomiting Pt's wife aware. Apt scheduled for tomorrow.

## 2010-08-15 NOTE — Assessment & Plan Note (Signed)
Summary: recheck BP- jr  Nurse Visit   Vital Signs:  Patient profile:   35 year old male Pulse rate:   73 / minute BP sitting:   119 / 78  (left arm) Cuff size:   large  Vitals Entered By: Payton Spark CMA (January 12, 2010 8:35 AM)   Allergies: No Known Drug Allergies Laboratory Results   Urine Tests    Routine Urinalysis   Color: yellow Appearance: Clear Glucose: negative   (Normal Range: Negative) Bilirubin: negative   (Normal Range: Negative) Ketone: negative   (Normal Range: Negative) Spec. Gravity: 1.025   (Normal Range: 1.003-1.035) Blood: negative   (Normal Range: Negative) pH: 5.5   (Normal Range: 5.0-8.0) Protein: negative   (Normal Range: Negative) Urobilinogen: 0.2   (Normal Range: 0-1) Nitrite: negative   (Normal Range: Negative) Leukocyte Esterace: negative   (Normal Range: Negative)       Orders Added: 1)  UA Dipstick w/o Micro (automated)  [81003] 2)  Est. Patient Level I [[address]]

## 2010-08-15 NOTE — Assessment & Plan Note (Signed)
Summary: hyperglycemia   Vital Signs:  Patient profile:   35 year old male Height:      75.5 inches Weight:      364 pounds BMI:     45.06 O2 Sat:      97 % on Room air Temp:     97.9 degrees F oral Pulse rate:   94 / minute BP sitting:   138 / 82  (left arm) Cuff size:   large  Vitals Entered By: Payton Spark CMA (April 27, 2010 10:18 AM)  O2 Flow:  Room air CC: F/U high sugar   Primary Care Provider:  Seymour Bars DO  CC:  F/U high sugar.  History of Present Illness: 35 yo HM presents for ED f/u.  He went to the ED on Tuesday because his sugars had continued to run high.  He has been taking Metformin and says that he was adherent with his diet.    He has been using Lantus 20 units at bedtime but his readings started going into the 400s.  He has not been using his Novolog which I had given him recently with a sliding scale.  Not sure why he wasn't using it other than maybe some confusion between the two insulins.  In the ED, he was given IVFs and regular insulin.  His labs and CXR were normal and he was sent home.  He had N/V for a few days which has improved.  His wife states that he had some mild mental status changes on Mon and Tuesday.  He has had some loose stools for a few days.  Denies anorexia or abd pain, fevers or chest pain.  Current Medications (verified): 1)  Synthroid 50 Mcg Tabs (Levothyroxine Sodium) .Marland Kitchen.. 1 Tab By Mouth At Bedtime 2)  Metformin Hcl 1000 Mg Tabs (Metformin Hcl) .Marland Kitchen.. 1 Tab By Mouth Two Times A Day 3)  Bd Ultrafine Pen Needles 31 G .... Use As Directed 4)  Ramipril 2.5 Mg Caps (Ramipril) .Marland Kitchen.. 1 Capsule By Mouth Daily 5)  Novolog Flexpen 100 Unit/ml Soln (Insulin Aspart) .... Use As Directed Per Sliding Scale 6)  Lantus Solostar 100 Unit/ml Soln (Insulin Glargine) .... 30 Units  At Bedtime Each Day 7)  Accu-Check Compact Plus Glucometer .... Dx: 250.00 8)  Accu-Check Compact Plus Test Strips .... Use 3 X A Day As Directed 9)  Ondansetron Hcl  8 Mg Tabs (Ondansetron Hcl) .Marland Kitchen.. 1 Tab By Mouth Three Times A Day As Needed Nausea  Allergies (verified): No Known Drug Allergies  Past History:  Past Medical History: Reviewed history from 04/17/2010 and no changes required. Cardiac Cath 2005 obesity Hypothyroidism  DM, obesity HTN  Social History: Reviewed history from 08/28/2006 and no changes required. Works at [employer] and at [employer].  Had 17 years of education.  Married to [employer]. Never Smoked Alcohol use-no Drug use-no Regular exercise-no  Review of Systems      See HPI  Physical Exam  General:  alert, well-developed, well-nourished, and well-hydrated.  obese, here with wife in NAD Head:  normocephalic and atraumatic.   Mouth:  pharynx pink and moist.  cobblestoning Neck:  no masses.   Lungs:  Normal respiratory effort, chest expands symmetrically. Lungs are clear to auscultation, no crackles or wheezes. Heart:  Normal rate and regular rhythm. S1 and S2 normal without gallop, murmur, click, rub or other extra sounds. Abdomen:  soft.   Extremities:  no LE edema Skin:  color normal.   Psych:  good eye contact, not anxious appearing, and not depressed appearing.     Impression & Recommendations:  Problem # 1:  DIABETES MELLITUS, TYPE II, CONTROLLED (ICD-250.00) Fasting sugar 192-- still high. I had advised him to raise his Lantus from 20--> 30 units  but he still did 20 units last night.  He was not using his Novolog for SSI as he was supposed to which is why he ended up with hyperglycemia, dehydration and nausea.  Will get BMP today and if bicarb/ renal function OK, can restart Metformin.  I reviewed his plan for sugar checks and use of BOTH long acting and short acting insulin, hydration with WATER and diabetic diet.  Call if any problems.  He will RTC in 2 wks with sguar readings on the higher dose of Lantus.   His updated medication list for this problem includes:    Metformin Hcl 1000 Mg Tabs (Metformin  hcl) .Marland Kitchen... 1 tab by mouth two times a day    Ramipril 2.5 Mg Caps (Ramipril) .Marland Kitchen... 1 capsule by mouth daily    Novolog Flexpen 100 Unit/ml Soln (Insulin aspart) ..... Use as directed per sliding scale    Lantus Solostar 100 Unit/ml Soln (Insulin glargine) .Marland KitchenMarland KitchenMarland KitchenMarland Kitchen 30 units [place] at bedtime each day  Orders: Fingerstick ([address]) Glucose, (CBG) ([address])  Problem # 2:  DEHYDRATION (ICD-276.51) No sign of clinical dehydration now.  This was secondary to hyperglycemia with resultant wt loss. Will work on improved glucose control and hydration with water.  BMP today. Orders: T-Basic Metabolic Panel [id number])  Problem # 3:  HYPERTENSION, BENIGN ESSENTIAL (ICD-401.1) He is to stay on Ramipril as long as his BUN/Cr are OK today.  I will not adjust since he was just discharged from the ED for dehydration.  His updated medication list for this problem includes:    Ramipril 2.5 Mg Caps (Ramipril) .Marland Kitchen... 1 capsule by mouth daily  BP today: 138/82 Prior BP: 141/76 (04/17/2010)  Prior 10 Yr Risk Heart Disease: 18 % (08/10/2009)  Labs Reviewed: K+: 4.1 (08/10/2009) Creat: : 0.81 (08/10/2009)   Chol: 227 (05/18/2008)   HDL: 35 (05/18/2008)   LDL: 162 (05/18/2008)   TG: 151 (05/18/2008)  Complete Medication List: 1)  Synthroid 50 Mcg Tabs (Levothyroxine sodium) .Marland Kitchen.. 1 tab by mouth at bedtime 2)  Metformin Hcl 1000 Mg Tabs (Metformin hcl) .Marland Kitchen.. 1 tab by mouth two times a day 3)  Bd Ultrafine Pen Needles 31 G  .... Use as directed 4)  Ramipril 2.5 Mg Caps (Ramipril) .Marland Kitchen.. 1 capsule by mouth daily 5)  Novolog Flexpen 100 Unit/ml Soln (Insulin aspart) .... Use as directed per sliding scale 6)  Lantus Solostar 100 Unit/ml Soln (Insulin glargine) .... 30 units [place] at bedtime each day 7)  Accu-check Compact Plus Glucometer  .... Dx: 250.00 8)  Accu-check Compact Plus Test Strips  .... Use 3 x a day as directed 9)  Ondansetron Hcl 8 Mg Tabs (Ondansetron hcl) .Marland Kitchen.. 1 tab by mouth three times a day as needed  nausea  Patient Instructions: 1)  Go up on Lantus to 30 units at bedtime everynight. 2)  Check sugars: 3)  AM fasting 80-120 is goal. 4)  2 hrs after dinner <160 is goal 5)  Labs today.  Will call you w/ results this afternoon. 6)  Hold Metformin until I tell you to restart it. 7)  Drink plenty of water and adhere to your diabetic diet. 8)  Use Novolog for SLIDING SCALE INSULIN AS NEEDED FOR READINGS >  200.   9)  Return with sugar readings in 2 wks.  Laboratory Results   Blood Tests     CBG Fasting:: 192mg /dL

## 2010-08-15 NOTE — Letter (Signed)
Summary: Out of Work  [facility]  [address]   [place], [place] [address]   Phone: [phone]  Fax: [phone]    Dec 01, 2009   Employee:  KEO SCHIRMER    To Whom It May Concern:   For Medical reasons, please excuse the above named employee from work for the following dates:  Start:   May 19th  End:   May 20th  If you need additional information, please feel free to contact our office.         Sincerely,    Seymour Bars DO

## 2010-08-15 NOTE — Letter (Signed)
Summary: Out of Work  [facility]  [address]   [place], [place] [address]   Phone: [phone]  Fax: [phone]    Nov 28, 2009   Employee:  GID SCHOFFSTALL    To Whom It May Concern:   For Medical reasons, please excuse the above named employee from work for the following dates:  Start:   May 16th - 17th  End:   May 18th  If you need additional information, please feel free to contact our office.         Sincerely,    Seymour Bars DO

## 2010-08-15 NOTE — Assessment & Plan Note (Signed)
Summary: nurse appt - jr  Nurse Visit   Vital Signs:  Patient profile:   35 year old male Height:      75.5 inches Pulse rate:   102 / minute BP sitting:   151 / 83  (left arm) Cuff size:   large  Vitals Entered By: Kathlene November (January 10, 2010 9:10 AM)  Allergies: No Known Drug Allergies  Orders Added: 1)  Est. Patient Level I [[address]] Prescriptions: ATENOLOL 50 MG TABS (ATENOLOL) 1 tab by mouth daily  #30 x 0   Entered and Authorized by:   Seymour Bars DO   Signed by:   Seymour Bars DO on 01/10/2010   Method used:   Electronically to        [facility] [phone]* (retail)       [address]       [place], [place]  [address]       Ph: [phone]       Fax: [phone]   RxID:   [id number]     Patient Instructions: 1)  Start Atenolol once a day for high BP and high pulse rate. 2)  Take it 1 hr prior to your next nurse visit. 3)  Return on Thursday to recheck.

## 2010-08-15 NOTE — Letter (Signed)
Summary: Physical Exam Form  Physical Exam Form   Imported By: Lanelle Bal 01/19/2010 14:27:31  _____________________________________________________________________  External Attachment:    Type:   Image     Comment:   External Document

## 2010-08-15 NOTE — Letter (Signed)
Summary: Conservation officer, historic buildings Form/[employer]   Imported By: Lanelle Bal 01/19/2010 14:25:39  _____________________________________________________________________  External Attachment:    Type:   Image     Comment:   External Document

## 2010-08-15 NOTE — Assessment & Plan Note (Signed)
Summary: f/u sugars   Vital Signs:  Patient profile:   34 year old male Height:      75.5 inches Weight:      373 pounds BMI:     46.17 O2 Sat:      97 % on Room air Pulse rate:   106 / minute BP sitting:   141 / 76  (left arm) Cuff size:   large  Vitals Entered By: Payton Spark CMA (April 17, 2010 1:47 PM)  O2 Flow:  Room air CC: 1 mo f/u. Stopped victoza and back on novolog sliding scale   Primary Care Provider:  Seymour Bars DO  CC:  1 mo f/u. Stopped victoza and back on novolog sliding scale.  History of Present Illness: 35 yo HF presnts for f/u diabetes.    He stopped Victoza due to nausea.  He is on Metformin 1000 mg two times a day but has been using Novolog for SSI.  He reports that his sugars are HIGH, up to 500 in the AMs.    He is set up to persue gastric bypass in the next 3 mos at [facility].  I have completed his paperwork.  Denies feeling 'bad' with high sugars and is upset that he continues to gain wt.  Doing well on the Ramipril.    Reports feeling more tired after taking levothyroxine.    Current Medications (verified): 1)  Levothyroxine Sodium 25 Mcg Tabs (Levothyroxine Sodium) .Marland Kitchen.. 1 Tab By Mouth Qpm 2)  Metformin Hcl 1000 Mg Tabs (Metformin Hcl) .Marland Kitchen.. 1 Tab By Mouth Two Times A Day 3)  Bd Ultrafine Pen Needles 31 G .... Use As Directed 4)  Ramipril 2.5 Mg Caps (Ramipril) .Marland Kitchen.. 1 Capsule By Mouth Daily 5)  Novolog Flexpen 100 Unit/ml Soln (Insulin Aspart) .... Use As Directed Per Sliding Scale  Allergies (verified): No Known Drug Allergies  Past History:  Past Medical History: Cardiac Cath 2005 obesity Hypothyroidism  DM, obesity HTN  Social History: Reviewed history from 08/28/2006 and no changes required. Works at [employer] and at [employer].  Had 17 years of education.  Married to [employer]. Never Smoked Alcohol use-no Drug use-no Regular exercise-no  Review of Systems      See HPI  Physical Exam  General:  alert, well-developed,  well-nourished, and well-hydrated.  obese Head:  normocephalic and atraumatic.   Mouth:  pharynx pink and moist.   Neck:  no masses.   Lungs:  Normal respiratory effort, chest expands symmetrically. Lungs are clear to auscultation, no crackles or wheezes. Heart:  Normal rate and regular rhythm. S1 and S2 normal without gallop, murmur, click, rub or other extra sounds. Extremities:  no LE edema Skin:  color normal.   Psych:  good eye contact, not anxious appearing, and not depressed appearing.     Impression & Recommendations:  Problem # 1:  DIABETES MELLITUS, TYPE II, CONTROLLED (ICD-250.00) Sugars are HIGH and he stopped Victoza due to nausea. Will start him on Lantus 20 units at bedtime.  He will titrate up to 2 units/ day until AM fastings are <150.  Needs to adhere to diabetic diet and stay on metformin.  Call if any problems.  New meter written for today.  F/U in 1 month. The following medications were removed from the medication list:    Victoza 18 Mg/33ml Soln (Liraglutide) .Marland Kitchen... 0.6 mg [place] injection once daily x 1 wk then increase to 1.2 mg [place] injection once daily His updated medication list for  this problem includes:    Metformin Hcl 1000 Mg Tabs (Metformin hcl) .Marland Kitchen... 1 tab by mouth two times a day    Ramipril 2.5 Mg Caps (Ramipril) .Marland Kitchen... 1 capsule by mouth daily    Novolog Flexpen 100 Unit/ml Soln (Insulin aspart) ..... Use as directed per sliding scale    Lantus Solostar 100 Unit/ml Soln (Insulin glargine) .Marland Kitchen... 20 units [place] at bedtime each day  Problem # 2:  UNSPECIFIED HYPOTHYROIDISM (ICD-244.9) I am going to sample him branded synthroid at 50 micrograms instead of 25 micrograms and see if his fatigue improves.  He will update his TSH in 2 wks. His updated medication list for this problem includes:    Synthroid 50 Mcg Tabs (Levothyroxine sodium) .Marland Kitchen... 1 tab by mouth at bedtime  Orders: T-TSH ([id number])  Problem # 3:  HYPERTENSION, BENIGN ESSENTIAL (ICD-401.1) BP has  improved but still a little high.  Plan to increase to 5 mg if high at f/u visit. His updated medication list for this problem includes:    Ramipril 2.5 Mg Caps (Ramipril) .Marland Kitchen... 1 capsule by mouth daily  BP today: 141/76 Prior BP: 152/84 (03/23/2010)  Prior 10 Yr Risk Heart Disease: 18 % (08/10/2009)  Labs Reviewed: K+: 4.1 (08/10/2009) Creat: : 0.81 (08/10/2009)   Chol: 227 (05/18/2008)   HDL: 35 (05/18/2008)   LDL: 162 (05/18/2008)   TG: 151 (05/18/2008)  Problem # 4:  OBESITY, UNSPECIFIED (ICD-278.00) BMI 46 c/w morbid obesity with co morbidities of T2DM, HTN, dyslipidemia, NAFLD. I fully support his decision to go ahead with gastric bypass and his paperwork has been completed.  Complete Medication List: 1)  Synthroid 50 Mcg Tabs (Levothyroxine sodium) .Marland Kitchen.. 1 tab by mouth at bedtime 2)  Metformin Hcl 1000 Mg Tabs (Metformin hcl) .Marland Kitchen.. 1 tab by mouth two times a day 3)  Bd Ultrafine Pen Needles 31 G  .... Use as directed 4)  Ramipril 2.5 Mg Caps (Ramipril) .Marland Kitchen.. 1 capsule by mouth daily 5)  Novolog Flexpen 100 Unit/ml Soln (Insulin aspart) .... Use as directed per sliding scale 6)  Lantus Solostar 100 Unit/ml Soln (Insulin glargine) .... 20 units [place] at bedtime each day 7)  Accu-check Compact Plus Glucometer  .... Dx: 250.00 8)  Accu-check Compact Plus Test Strips  .... Use 3 x a day as directed  Other Orders: Admin 1st Vaccine (09811) Flu Vaccine 62yrs + (91478)  Patient Instructions: 1)  Start Lantus 20 units at bedtime each day at bedtime. 2)  Check AM fasting sugars.  If >150 in the AM, go up on Lantus by 2 units everyday. 3)  Stay on Metformin 1000 mg two times a day. 4)  RX for new meter and test strips sent. 5)  Change Levothyroxine to branded Synthroid 50 micrograms take at bedtime.  REcheck TSH in 2 wks. 6)  Return for f/u sugars in 4 wks. Flu Vaccine Consent Questions     Do you have a history of severe allergic reactions to this vaccine? no    Any prior history of  allergic reactions to egg and/or gelatin? no    Do you have a sensitivity to the preservative Thimersol? no    Do you have a past history of Guillan-Barre Syndrome? no    Do you currently have an acute febrile illness? no    Have you ever had a severe reaction to latex? no    Vaccine information given and explained to patient? yes    Are you currently pregnant? no  Lot Number:[id number]   Exp Date:01/13/2011   Site Given  Left Deltoid IMctions-CCC] Prescriptions: ACCU-CHECK COMPACT PLUS TEST STRIPS use 3 x a day as directed  #90 x 3   Entered and Authorized by:   Seymour Bars DO   Signed by:   Seymour Bars DO on 04/17/2010   Method used:   Printed then faxed to ...       [facility] [phone]* (retail)       [address] [place], [place]  [address]       Ph: [phone]       Fax: [phone]   RxID:   [id number] ACCU-CHECK COMPACT PLUS GLUCOMETER dx: 250.00  #1 x 0   Entered and Authorized by:   Seymour Bars DO   Signed by:   Seymour Bars DO on 04/17/2010   Method used:   Printed then faxed to ...       [facility] [phone]* (retail)       [address] [place], [place]  [address]       Ph: [phone]       Fax: [phone]   RxID:   [id number] LANTUS SOLOSTAR 100 UNIT/ML SOLN (INSULIN GLARGINE) 20 units [place] at bedtime each day  #1 box x 2   Entered and Authorized by:   Seymour Bars DO   Signed by:   Seymour Bars DO on 04/17/2010   Method used:   Electronically to        [facility] [phone]* (retail)       [address]       [place], [place]  [address]       Ph: [phone]       Fax: [phone]   RxID:   [id number]    .lbflu

## 2010-08-15 NOTE — Assessment & Plan Note (Signed)
Summary: HFU abd pain/ sugar   Vital Signs:  Patient profile:   35 year old male Height:      75.5 inches Weight:      364 pounds BMI:     45.06 O2 Sat:      98 % on Room air Pulse rate:   92 / minute BP sitting:   127 / 84  (left arm) Cuff size:   large  Vitals Entered By: Payton Spark CMA (March 15, 2010 2:22 PM)  O2 Flow:  Room air CC: Hosp f/u.   Primary Care Provider:  Seymour Bars DO  CC:  Hosp f/u.Marland Kitchen  History of Present Illness: 35 yo HM presents for LUQ pain x 1 wk.  He was diagnosed with mesenteric lymphadenitis.  He was put IVFs and his sugar came down from 500.  His initally given Avelox.  He was changed to Cipro yesterday but he has not yet started it.    He has had some diarrhea.  He has nausea but no vomitting.  He was given anti emetics.  He was sent home with Percocet yesterday.  He is not eating.    He has subjective fevers and frequent urination. He had a normal UA yesterday.      Current Medications (verified): 1)  Levothyroxine Sodium 25 Mcg Tabs (Levothyroxine Sodium) .Marland Kitchen.. 1 Tab By Mouth Daily 2)  Atenolol 50 Mg Tabs (Atenolol) .Marland Kitchen.. 1 Tab By Mouth Daily 3)  Janumet 50-1000 Mg Tabs (Sitagliptin-Metformin Hcl) .... Take 1 Tablet By Mouth Two Times A Day  Allergies (verified): No Known Drug Allergies  Past History:  Past Medical History: Cardiac Cath 2005 obesity Hypothyroidism - off meds DM, diet - controlled  endo: HP.  Past Surgical History: Reviewed history from 08/28/2006 and no changes required. Appendix 1988  Social History: Reviewed history from 08/28/2006 and no changes required. Works at [employer] and at [employer].  Had 17 years of education.  Married to [employer]. Never Smoked Alcohol use-no Drug use-no Regular exercise-no  Review of Systems      See HPI  Physical Exam  General:  alert, well-developed, well-nourished, and well-hydrated.  obese Head:  normocephalic and atraumatic.   Eyes:  sclera non icteric; wears  glasses Mouth:  pharynx pink and moist.   Neck:  no masses.   Lungs:  Normal respiratory effort, chest expands symmetrically. Lungs are clear to auscultation, no crackles or wheezes. Heart:  Normal rate and regular rhythm. S1 and S2 normal without gallop, murmur, click, rub or other extra sounds. Abdomen:  LUQ and epigastric TTP with guarding; NABS.  No HSM.  mild distension with truncal obesity Extremities:  1+ pitting LE edema Skin:  color normal.   Cervical Nodes:  No lymphadenopathy noted Psych:  good eye contact, not anxious appearing, and not depressed appearing.     Impression & Recommendations:  Problem # 1:  LUQ PAIN (ICD-789.02) Assessment New Will review 2 sets of ED records.  Concern for degree of abdominal pain w/o food intake and constitutional symptoms.  Will get an AAS today and will start him on Cipro 500 mg q 12 hrs x 7 days.  Call if any worsening.  Treat pain/ nausea with RX PHenergan with Codeine.  Clear liquid diet x 48 hrs.  RTC on Tues for f/u.   Orders: T-DG ABD Acute w/Chest (95284)  Problem # 2:  DIABETES MELLITUS, TYPE II, CONTROLLED (ICD-250.00) Sugars running high with recent bout of mesenteric lymphadenitis as dx'd in the  ED -- may also be from not taking his JanuMet.  REstarted this today and given a Humalog sample pen and a sliding scale to use as needed.   His updated medication list for this problem includes:    Janumet 50-1000 Mg Tabs (Sitagliptin-metformin hcl) .Marland Kitchen... Take 1 tablet by mouth two times a day    Humalog Kwikpen 100 Unit/ml Soln (Insulin lispro (human)) ..... Use for sliding scale insulin  Orders: Fingerstick (36416) Hemoglobin A1C (83036)  Complete Medication List: 1)  Levothyroxine Sodium 25 Mcg Tabs (Levothyroxine sodium) .Marland Kitchen.. 1 tab by mouth daily 2)  Atenolol 50 Mg Tabs (Atenolol) .Marland Kitchen.. 1 tab by mouth daily 3)  Janumet 50-1000 Mg Tabs (Sitagliptin-metformin hcl) .... Take 1 tablet by mouth two times a day 4)  Humalog Kwikpen 100  Unit/ml Soln (Insulin lispro (human)) .... Use for sliding scale insulin 5)  Bd Ultrafine Pen Needles 31 G  .... Use as directed 6)  Cipro 500 Mg Tabs (Ciprofloxacin hcl) .Marland Kitchen.. 1 tab by mouth q 12 hrs x 7 days 7)  Promethazine-codeine 6.25-10 Mg/24ml Syrp (Promethazine-codeine) .... 5-10 ml by mouth q 6 hrs as needed nausea/ abd pain  Patient Instructions: 1)  Use Promethazine with Codeine for abdominal pain and nausea.  It will make you sleepy. 2)  Use Humalog Kwik Pen for sliding scale. 3)  Stay on Janumet. 4)  Check sugars: AM fasting, 2 hrs after dinner. 5)  Call if >400. 6)  Start on Cipro today. 7)  XRAY today.  Will call you w/ results tomorrow. 8)  Return for recheck in 1 wk. Prescriptions: PROMETHAZINE-CODEINE 6.25-10 MG/5ML SYRP (PROMETHAZINE-CODEINE) 5-10 ml by mouth q 6 hrs as needed nausea/ abd pain  #200 ml x 0   Entered and Authorized by:   Seymour Bars DO   Signed by:   Seymour Bars DO on 03/15/2010   Method used:   Printed then faxed to ...       [facility] [phone]* (retail)       [address] [place], [place]  [address]       Ph: [phone]       Fax: [phone]   RxID:   [id number] CIPRO 500 MG TABS (CIPROFLOXACIN HCL) 1 tab by mouth q 12 hrs x 7 days  #14 x 0   Entered and Authorized by:   Seymour Bars DO   Signed by:   Seymour Bars DO on 03/15/2010   Method used:   Electronically to        [facility] [phone]* (retail)       [address]       [place], [place]  [address]       Ph: [phone]       Fax: [phone]   RxID:   [id number] BD ULTRAFINE PEN NEEDLES 31 G use as directed  #60 x 1   Entered and Authorized by:   Seymour Bars DO   Signed by:   Seymour Bars DO on 03/15/2010   Method used:   Printed then faxed to ...       [facility] [phone]* (retail)       [address] [place], [place]  [address]       Ph: [phone]       Fax: [phone]   RxID:   [id number]   Laboratory Results   Blood  Tests  HGBA1C: 8.1%   (Normal Range: Non-Diabetic - 3-6%   Control Diabetic - 6-8%)

## 2010-08-15 NOTE — Progress Notes (Signed)
Summary: Biopsy results  Phone Note Call from Patient   Caller: Patient [phone] ex [phone] Summary of Call: Pt would like results of biopsy.  Initial call taken by: Payton Spark CMA,  Dec 09, 2009 10:52 AM  Follow-up for Phone Call        I called and spoke to pt's wife about results c/w chronic thyroiditis, known.  No sign of cancer.  Due to fam hx, he may need annual thyroid u/s and family does request f/u with endocrinology.  Will set this up. Follow-up by: Seymour Bars DO,  Dec 09, 2009 11:36 AM

## 2010-08-15 NOTE — Progress Notes (Signed)
Summary: CALL A NURSE  Phone Note From Other Clinic   Caller: CALL A NURSE Summary of Call: [facility] Triage Call Report Triage Record Num: [id number] Operator: Sharene Skeans Patient Name: Casey Fletcher Call Date & Time: 04/24/2010 6:18:35PM Patient Phone: [phone] PCP: Seymour Bars, DO Patient Gender: Male PCP Fax : [phone] Patient DOB: [birth year] Practice Name: [facility] Reason for Call: Wife is calling about her husbands blood sugar being elevated x 3 days. Pt is very tired, thirsty and is urinating very frequently. Pt is warm to touch. Wife stated that he has been confused today. Sent to [facility] ED/See ED Immediatley. Initial call taken by: Payton Spark CMA,  April 25, 2010 8:16 AM  Follow-up for Phone Call        can you please f/u with them today and see if he has been admitted Follow-up by: Seymour Bars DO,  April 25, 2010 9:02 AM     Appended Document: CALL A NURSE Pt was treated with IV fluids and released last night. Pt is at home resting and feeling better. Pt has f/u w/ you next week.

## 2010-08-15 NOTE — Progress Notes (Signed)
Summary: ED call  Phone Note Other Incoming   Caller: ED Doc Summary of Call: Pt seen in ED.  Put on abx for colitis and sugars in the 400s. Given insulin at the ED. ED doc will restart Janumet and he will keep his F/u in Wednesday with Dr. Cathey Endow.  Initial call taken by: Nani Gasser MD,  March 13, 2010 12:11 PM    New/Updated Medications: JANUMET 50-1000 MG TABS (SITAGLIPTIN-METFORMIN HCL) Take 1 tablet by mouth two times a day

## 2010-08-15 NOTE — Letter (Signed)
Summary: Out of Work  [facility]  [address], [address]   [place], [place] [address]   Phone: [phone]  Fax: [phone]    April 27, 2010   Employee:  RETT STEHLIK    To Whom It May Concern:   For Medical reasons, please excuse the above named employee from work for the following dates:  Start:   October 10th -14th  patient is being treated for the same condition and it may require intermittent absences  End:   Oct 15th  If you need additional information, please feel free to contact our office.         Sincerely,    Seymour Bars DO

## 2010-08-15 NOTE — Consult Note (Signed)
Summary: [facility]   Imported By: Lanelle Bal 06/20/2010 13:54:22  _____________________________________________________________________  External Attachment:    Type:   Image     Comment:   External Document

## 2010-08-15 NOTE — Assessment & Plan Note (Signed)
Summary: DM/ thyroid   Vital Signs:  Patient profile:   35 year old male Height:      75.5 inches Weight:      355 pounds BMI:     43.94 O2 Sat:      98 % on Room air Temp:     98.3 degrees F oral Pulse rate:   96 / minute BP sitting:   136 / 87  (left arm) Cuff size:   large  Vitals Entered By: Payton Spark CMA (Nov 28, 2009 4:12 PM)  O2 Flow:  Room air CC: Feels like sugar is high again. Also c/o rash on back of neck and R 2nd finger pain.    Primary Care Provider:  Nani Gasser MD  CC:  Feels like sugar is high again. Also c/o rash on back of neck and R 2nd finger pain. Marland Kitchen  History of Present Illness: 35 yo HM presents for several issues.  1.  T2DM- Currently diet controlled with excellent A1C but having highs recently which may be causing his fatigue. Home readings of random 400 after eating ice cream yesterday.  Denies paresthesias, polyuria or blurry vision.  2.  For his thyroid, endocrinology took him off thyroid meds last year.  He is feeling much more fatigued and has had wide weight fluctuations, recently gaining with c/o fatigue.  Fam hx of thyroid cancer.    3. Has a rash with 'acne' like lesions over the posterior scalp, nontender.  4.  Has weeks of non traumatic R index finger pain.  Unkown causse.  No redness, edema or previous imaging.  Able to flex but w/ pain.  5.  Denies feeling depressed but admits to emotional eating, lack of motivation and increased stress load at work.  Has recently gained wt.    Current Medications (verified): 1)  None  Allergies (verified): No Known Drug Allergies  Past History:  Past Medical History: Cardiac Cath 2005 obesity Hypothyroidism - off meds DM, diet - controlled  Past Surgical History: Reviewed history from 08/28/2006 and no changes required. Appendix 1988  Social History: Reviewed history from 08/28/2006 and no changes required. Works at [employer] and at [employer].  Had 17 years of education.   Married to [employer]. Never Smoked Alcohol use-no Drug use-no Regular exercise-no  Review of Systems      See HPI  Physical Exam  General:  obese HM in NAD Head:  normocephalic and atraumatic.  follicular papular rash over posterior occiut Eyes:  PERRLA Mouth:  good dentition and pharynx pink and moist.   Neck:  supple.  thyromegaly, moves freeely with swallowing Lungs:  Normal respiratory effort, chest expands symmetrically. Lungs are clear to auscultation, no crackles or wheezes. Heart:  Normal rate and regular rhythm. S1 and S2 normal without gallop, murmur, click, rub or other extra sounds. Extremities:  no LE edema Skin:  color normal.   Cervical Nodes:  No lymphadenopathy noted Psych:  good eye contact, not anxious appearing, and not depressed appearing.     Impression & Recommendations:  Problem # 1:  DIABETES MELLITUS, TYPE II, CONTROLLED (ICD-250.00) Off meds.  A1C 6.3= excellent.  Not c/w pt's reported readins in the 400s.  He does admit to eating ice cream in the last couple days before a high reading.  Will check serum glucose today and f/u results tomorrow. Orders: Fingerstick (36416) Hemoglobin A1C (83036) T-Glucose, Blood ([id number])  Labs Reviewed: Creat: 0.81 (08/10/2009)   Microalbumin: 30 (08/10/2009)  Last Eye Exam:  normal (10/22/2007) Reviewed HgBA1c results: 6.8 (08/10/2009)  Problem # 2:  UNSPECIFIED HYPOTHYROIDISM (ICD-244.9) Off thyroid meds after a visit to the endocrinologist in 2010.  due to his fatigue and thyromegaly, will update labs today and get a thyroid u/s.  Family concerned about a fam hx of thyroid cancer. Orders: T-TSH [phone]) T-T3, Free [phone]) T-T4, Free [phone]) T-*Unlisted Diagnostic X-ray test/procedure [phone])  Problem # 3:  DEPRESSION (ICD-311) PHQ-9 scoare of 18 c/w moderate to severe depression, like the cause for his acute faitgue and binge eating. Will begin tx with Wellburin 150 mg/ day and call if  any problems.  RTC in 3 wks to repeat score. His updated medication list for this problem includes:    Budeprion Xl 150 Mg Xr24h-tab (Bupropion hcl) .Marland Kitchen... 1 tab by mouth once a day  Problem # 4:  FINGER PAIN (ICD-729.5) Unknown cause of R index finger pain w/o trauma.  Concern over his need to use index finger to operate a gun once he gets back to the [employer]. Orders: Sports Medicine (Sports Med)  Problem # 5:  FOLLICULITIS (ICD-704.8) Treat with antibacerial soap and water with topical clindamycin gel.    Problem # 6:  OBESITY, UNSPECIFIED (ICD-278.00) BMI 43 c/w class III obesity. Pt admits to binge eating due to stress and yo yo dieting.  He has been taking phentermine thru a 'wt loss clinic' which has helped.   I did not RF this for him today. I'd him to work on him to work on [employer] and exercise.  RTC in 3 wks for f/u.  Complete Medication List: 1)  Budeprion Xl 150 Mg Xr24h-tab (Bupropion hcl) .Marland Kitchen.. 1 tab by mouth once a day 2)  Clindamycin Phosphate 1 % Gel (Clindamycin phosphate) .... Use on rash two times a day  Patient Instructions: 1)  Start on Buproprion once a day for mood. 2)  Lab today. 3)  Will schedule thyroid u/s downstairs. 4)  REturn for f/u mood/ weight in 3 wks. 5)  sports med referral made for finger. Prescriptions: CLINDAMYCIN PHOSPHATE 1 % GEL (CLINDAMYCIN PHOSPHATE) use on rash two times a day  #1 tube x 0   Entered and Authorized by:   Seymour Bars DO   Signed by:   Seymour Bars DO on 11/28/2009   Method used:   Electronically to        [facility] [phone]* (retail)       [address]       [place], [place]  [address]       Ph: [phone]       Fax: [phone]   RxID:   [id number] BUDEPRION XL 150 MG XR24H-TAB (BUPROPION HCL) 1 tab by mouth once a day  #30 x 2   Entered and Authorized by:   Seymour Bars DO   Signed by:   Seymour Bars DO on 11/28/2009   Method used:   Electronically to        [facility] [phone]*  (retail)       [address]       [place], [place]  [address]       Ph: [phone]       Fax: [phone]   RxID:   [id number]

## 2010-08-17 NOTE — Assessment & Plan Note (Signed)
Summary: HFU RYGB/ bleeding   Vital Signs:  Patient profile:   35 year old male Height:      75.5 inches Weight:      340 pounds BMI:     42.09 O2 Sat:      99 % on Room air Pulse rate:   102 / minute BP sitting:   136 / 80  (left arm) Cuff size:   large  Vitals Entered By: Payton Spark CMA (August 02, 2010 10:35 AM)  O2 Flow:  Room air CC: Hosp f/u. Feeling weak every morning and gets dizzy if standing for long periods of time.  CBG Result 131   Primary Care Provider:  Seymour Bars DO  CC:  Hosp f/u. Feeling weak every morning and gets dizzy if standing for long periods of time. Marland Kitchen  History of Present Illness: 35 yo WM presents for visit.  He had gastric bypass surgery on 07-24-2010 with Dr Johna Sheriff.  He had rectal bleeding on post op day #1.  He went to the ICU and received blood transfusions and fresh frozen plasma.  He was just discharged home on Sunday.  He is feeling very weak and dizzy. He is on clear liquid diet. He is off all of his meds.  He is on Centrum MVI, Calcium- liquid though he says that his MVI makes him nauseated.    He goes back next wk for his post op visit.  His last BM was 2 days ago and it was dark and tary. He has not had red blood since the hospital.  He is not vomitting.  He is not having fevers.     Current Medications (verified): 1)  None  Allergies (verified): No Known Drug Allergies  Past History:  Past Medical History: Reviewed history from 04/17/2010 and no changes required. Cardiac Cath 2005 obesity Hypothyroidism  DM, obesity HTN  Past Surgical History: Appendix 1988 Jan 2012 gastric bypass - Dr Johna Sheriff  Social History: Reviewed history from 08/28/2006 and no changes required. Works at [employer] and at [employer].  Had 17 years of education.  Married to [employer]. Never Smoked Alcohol use-no Drug use-no Regular exercise-no  Review of Systems      See HPI  Physical Exam  General:  alert, well-developed,  well-nourished, well-hydrated, and overweight-appearing.   Head:  normocephalic and atraumatic.   Eyes:  sclera non icteric Mouth:  pharynx pink and moist.   Neck:  no masses.   Lungs:  Normal respiratory effort, chest expands symmetrically. Lungs are clear to auscultation, no crackles or wheezes. Heart:  no murmur and tachycardia.   Abdomen:  incisions healing well with granulation tissue and localized hyperemia over the L lateral wall insion.  Steri strips in place.  NABS,  ND. Extremities:  no LE edema Skin:  pale. skin warm and dry Cervical Nodes:  No lymphadenopathy noted Psych:  good eye contact, not anxious appearing, and not depressed appearing.     Impression & Recommendations:  Problem # 1:  UNSPECIFIED ANEMIA (ICD-285.9) Profound postop anemia from GI bleed on POD#1, s/p ICU stay with transfusion of rRBCs and FFP.  He is definitely still healing, appears pale, is mildly tachycardic and tired.  Reminded him that it will take time for his energy level to come back.  HGB stable at 11.  REviewed supplement intake.  He is still passing some melena, likely from bleeding of the proximal colon days ago.  No hematochezia, abd pain is reassuring.  Reminded pt no  ASA or NSAIDs.  Has f/u with surgeon next wk. Orders: Hgb ([address]) T-CBC w/Diff [phone]) T-Ferritin [phone]) T-Iron [phone]) T-Iron Binding Capacity (TIBC) ([id number])  Hgb: 14.4 (05/18/2008)   Hct: 43.8 (05/18/2008)   Platelets: 221 (05/18/2008) RBC: 4.69 (05/18/2008)   RDW: 12.4 (05/18/2008)   WBC: 11.0 (08/02/2010) MCV: 93.3 (05/18/2008)   MCHC: 33.0 (05/18/2008) TSH: 10.366 (11/29/2009)  Problem # 2:  DIABETES MELLITUS, TYPE II, CONTROLLED (ICD-250.00) Off meds, 1 wk s/p RYGB. Glucose stable today.  Has nutrition appt.  ON clear liquid diet and monitoring home sugars.  The following medications were removed from the medication list:    Metformin Hcl 1000 Mg Tabs (Metformin hcl) .Marland Kitchen... 1 tab by mouth two  times a day    Ramipril 2.5 Mg Caps (Ramipril) .Marland Kitchen... 1 capsule by mouth daily    Novolog Flexpen 100 Unit/ml Soln (Insulin aspart) ..... Use as directed per sliding scale    Lantus Solostar 100 Unit/ml Soln (Insulin glargine) .Marland KitchenMarland KitchenMarland KitchenMarland Kitchen 30 units [place] at bedtime each day  Problem # 3:  HYPERTENSION, BENIGN ESSENTIAL (ICD-401.1) Assessment: Improved BP stable OFF meds following RYGB just 1 wk ago.  Will continue to monitor off meds. The following medications were removed from the medication list:    Ramipril 2.5 Mg Caps (Ramipril) .Marland Kitchen... 1 capsule by mouth daily  BP today: 136/80 Prior BP: 138/82 (04/27/2010)  Prior 10 Yr Risk Heart Disease: 18 % (08/10/2009)  Labs Reviewed: K+: 4.0 (04/27/2010) Creat: : 0.90 (04/27/2010)   Chol: 227 (05/18/2008)   HDL: 35 (05/18/2008)   LDL: 162 (05/18/2008)   TG: 151 (05/18/2008)  Problem # 4:  UNSPECIFIED HYPOTHYROIDISM (ICD-244.9)  The following medications were removed from the medication list:    Synthroid 50 Mcg Tabs (Levothyroxine sodium) .Marland Kitchen... 1 tab by mouth at bedtime His updated medication list for this problem includes:    Levoxyl 50 Mcg Tabs (Levothyroxine sodium) .Marland Kitchen... 1 tab by mouth daily  Orders: T-TSH [phone] [phone])  Labs Reviewed: TSH: 10.366 (11/29/2009)    HgBA1c: 5.9 (08/02/2010) Chol: 227 (05/18/2008)   HDL: 35 (05/18/2008)   LDL: 162 (05/18/2008)   TG: 151 (05/18/2008)  Complete Medication List: 1)  Levoxyl 50 Mcg Tabs (Levothyroxine sodium) .Marland Kitchen.. 1 tab by mouth daily  Other Orders: Fingerstick (36416) Hemoglobin A1C (83036) Glucose, (CBG) (53664) T-Comprehensive Metabolic Panel ([id number])  Patient Instructions: 1)  Labs today. 2)  Will call you w/ results tomorrow. 3)  Will fax a copy to Dr Johna Sheriff for your follow up appt. 4)  Call if any bright red rectal bleeding, fevers, etc. 5)  Take it easy.  Continue clear liquid diet. 6)  REturn for f/u in 2 mos. Prescriptions: LEVOXYL 50 MCG TABS (LEVOTHYROXINE SODIUM) 1 tab  by mouth daily  #30 x 1   Entered and Authorized by:   Seymour Bars DO   Signed by:   Seymour Bars DO on 08/03/2010   Method used:   Electronically to        [facility] [phone]* (retail)       [address] [place], [place]  [address]       Ph: [phone]       Fax: [phone]   RxID:   [id number]    Orders Added: 1)  Fingerstick [36416] 2)  Hemoglobin A1C [83036] 3)  Hgb [85018] 4)  Glucose, (CBG) [82962] 5)  T-TSH [62376-28315] 6)  T-CBC w/Diff [17616-07371] 7)  T-Ferritin [06269-48546] 8)  T-Iron [27035-00938] 9)  T-Iron Binding Capacity (TIBC) [[address]] 10)  T-Comprehensive Metabolic Panel [80053-22900] 11)  Est. Patient Level IV [99214]    Laboratory Results   Blood Tests     HGBA1C: 5.9%   (Normal Range: Non-Diabetic - 3-6%   Control Diabetic - 6-8%) CBG Random:: 131mg /dL   CBC WBC:  40.9  K/uL  (Normal Range: 4.5-11.0)

## 2010-09-04 ENCOUNTER — Encounter: Payer: Self-pay | Admitting: Family Medicine

## 2010-09-07 ENCOUNTER — Encounter: Payer: Self-pay | Admitting: Family Medicine

## 2010-09-12 NOTE — Letter (Signed)
Summary: [facility]   Imported By: Lanelle Bal 09/06/2010 14:10:23  _____________________________________________________________________  External Attachment:    Type:   Image     Comment:   External Document

## 2010-09-19 ENCOUNTER — Encounter: Payer: BC Managed Care – PPO | Attending: General Surgery | Admitting: *Deleted

## 2010-09-19 DIAGNOSIS — Z713 Dietary counseling and surveillance: Secondary | ICD-10-CM | POA: Insufficient documentation

## 2010-09-19 DIAGNOSIS — Z09 Encounter for follow-up examination after completed treatment for conditions other than malignant neoplasm: Secondary | ICD-10-CM | POA: Insufficient documentation

## 2010-09-19 DIAGNOSIS — Z9884 Bariatric surgery status: Secondary | ICD-10-CM | POA: Insufficient documentation

## 2010-09-25 LAB — COMPREHENSIVE METABOLIC PANEL
AST: 43 U/L — ABNORMAL HIGH (ref 0–37)
CO2: 27 mEq/L (ref 19–32)
Chloride: 104 mEq/L (ref 96–112)
Creatinine, Ser: 0.82 mg/dL (ref 0.4–1.5)
GFR calc Af Amer: >60 mL/min (ref >60)
GFR calc non Af Amer: >60 mL/min (ref >60)
Glucose, Bld: 210 mg/dL — ABNORMAL HIGH (ref 70–99)
Total Bilirubin: 1.3 mg/dL — ABNORMAL HIGH (ref 0.3–1.2)

## 2010-09-25 LAB — DIFFERENTIAL
Basophils Absolute: 0 10*3/uL (ref 0.0–0.1)
Eosinophils Absolute: 0.1 10*3/uL (ref 0.0–0.7)
Eosinophils Relative: 3 % (ref 0–5)
Lymphocytes Relative: 29 % (ref 12–46)
Neutrophils Relative %: 60 % (ref 43–77)

## 2010-09-25 LAB — CBC
HCT: 44.4 % (ref 39.0–52.0)
Hemoglobin: 14.9 g/dL (ref 13.0–17.0)
MCH: 31 pg (ref 26.0–34.0)
MCV: 92.5 fL (ref 78.0–100.0)
RBC: 4.8 MIL/uL (ref 4.22–5.81)

## 2010-09-25 LAB — SURGICAL PCR SCREEN: Staphylococcus aureus: NEGATIVE

## 2010-09-27 ENCOUNTER — Encounter: Payer: Self-pay | Admitting: Family Medicine

## 2010-10-06 ENCOUNTER — Ambulatory Visit: Payer: Self-pay | Admitting: Family Medicine

## 2010-10-08 ENCOUNTER — Encounter: Payer: Self-pay | Admitting: Family Medicine

## 2010-10-13 ENCOUNTER — Ambulatory Visit: Payer: BC Managed Care – PPO | Admitting: Family Medicine

## 2010-11-06 ENCOUNTER — Encounter: Payer: Self-pay | Admitting: Family Medicine

## 2010-11-06 ENCOUNTER — Ambulatory Visit (INDEPENDENT_AMBULATORY_CARE_PROVIDER_SITE_OTHER): Payer: BC Managed Care – PPO | Admitting: Family Medicine

## 2010-11-06 VITALS — BP 136/92 | HR 80 | Ht 76.0 in | Wt 287.0 lb

## 2010-11-06 DIAGNOSIS — E039 Hypothyroidism, unspecified: Secondary | ICD-10-CM

## 2010-11-06 DIAGNOSIS — Z130 Encounter for screening for diseases of the blood and blood-forming organs and certain disorders involving the immune mechanism: Secondary | ICD-10-CM

## 2010-11-06 DIAGNOSIS — Z9884 Bariatric surgery status: Secondary | ICD-10-CM

## 2010-11-06 DIAGNOSIS — E119 Type 2 diabetes mellitus without complications: Secondary | ICD-10-CM

## 2010-11-06 DIAGNOSIS — I10 Essential (primary) hypertension: Secondary | ICD-10-CM

## 2010-11-06 DIAGNOSIS — R5383 Other fatigue: Secondary | ICD-10-CM

## 2010-11-06 DIAGNOSIS — R5381 Other malaise: Secondary | ICD-10-CM

## 2010-11-06 LAB — POCT GLYCOSYLATED HEMOGLOBIN (HGB A1C): Hemoglobin A1C: 4.6

## 2010-11-06 NOTE — Progress Notes (Signed)
  Subjective:    Patient ID: Casey Fletcher, male    DOB: October 13, 1975, 35 y.o.   MRN: [id number]  HPI  35 yo HM presents for fatigue.  He is s/p RYGB from Jan 2012 and is doing well.  He has lost about 100 lbs.  He is off meds for DM, BP.  Due for labs.  Sees Dr Ezzard Standing.  Last had CBC only in Feb.  He stopped his thyroid medicine in Jan and is due to have TSH checked given his fatigue.  He claims to be taking his bariatric supplements.  Denies rectla bleeding, abd pain, chest pain or SOB.  He is walking more and eating healthy.  BP 136/92  Pulse 80  Ht 6\' 4"  (1.93 m)  Wt 287 lb (130.182 kg)  BMI 34.93 kg/m2  SpO2 100%  Past Medical History  Diagnosis Date  . Obesity   . Hypothyroidism   . Hypertension   . Diabetes mellitus     Past Surgical History  Procedure Date  . Cardiac catheterization 2005  . Appendectomy 1988  . Gastric bypass 1-12    Dr Johna Sheriff    Family History  Problem Relation Age of Onset  . Hyperlipidemia Mother   . Hypertension Mother   . Hyperlipidemia Father   . Hypertension Father     History   Social History  . Marital Status: Married    Spouse Name: N/A    Number of Children: N/A  . Years of Education: N/A   Occupational History  . Not on file.   Social History Main Topics  . Smoking status: Never Smoker   . Smokeless tobacco: Not on file  . Alcohol Use: No  . Drug Use: No  . Sexually Active:    Other Topics Concern  . Not on file   Social History Narrative  . No narrative on file    Not on File  Current outpatient prescriptions:levothyroxine (SYNTHROID, LEVOTHROID) 50 MCG tablet, Take 50 mcg by mouth daily.  , Disp: , Rfl:    Review of Systems  Constitutional: Positive for fatigue. Negative for fever.  Respiratory: Negative for shortness of breath.   Cardiovascular: Negative for chest pain, palpitations and leg swelling.  Gastrointestinal: Negative for nausea, abdominal pain, diarrhea, constipation and blood in stool.    Neurological: Negative for weakness, light-headedness and headaches.  Psychiatric/Behavioral: Negative for dysphoric mood. The patient is not nervous/anxious.        Objective:   Physical Exam  Constitutional: He appears well-developed and well-nourished.  HENT:  Head: Normocephalic and atraumatic.  Eyes: Conjunctivae are normal. No scleral icterus.  Neck: No thyromegaly present.  Cardiovascular: Normal rate, regular rhythm and normal heart sounds.   No murmur heard. Pulmonary/Chest: Effort normal and breath sounds normal. No respiratory distress. He has no wheezes.  Musculoskeletal: He exhibits no edema.  Skin: Skin is warm.  Psychiatric: He has a normal mood and affect.          Assessment & Plan:

## 2010-11-06 NOTE — Assessment & Plan Note (Signed)
DBP is > 90 today.  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

## 2010-11-06 NOTE — Assessment & Plan Note (Signed)
Fatigue, 4 mos after RYGB.  He is off thyroid meds and due to recheck TSH today.  He claims to be taking his supplements but will update his labs today and fax copy to Dr Ezzard Standing.  He is doing quite well with diet and exercise changes.

## 2010-11-06 NOTE — Assessment & Plan Note (Signed)
Doing well.  Update labs and will review his supplements after I get his labs bck.  He no longer has IFG or T2DM!  A1c is 4.6 today!  Keeping an eye on DBP.

## 2010-11-06 NOTE — Patient Instructions (Signed)
Labs today. Will call you w/ results tomorrow.  Return for a PHYSICAL with cholesterol check in 4 mos.

## 2010-11-07 ENCOUNTER — Ambulatory Visit (INDEPENDENT_AMBULATORY_CARE_PROVIDER_SITE_OTHER): Payer: BC Managed Care – PPO | Admitting: Family Medicine

## 2010-11-07 ENCOUNTER — Telehealth: Payer: Self-pay | Admitting: Family Medicine

## 2010-11-07 DIAGNOSIS — E538 Deficiency of other specified B group vitamins: Secondary | ICD-10-CM

## 2010-11-07 LAB — CBC WITH DIFFERENTIAL/PLATELET
Basophils Absolute: 0 10*3/uL (ref 0.0–0.1)
Basophils Relative: 0 % (ref 0–1)
MCHC: 32 g/dL (ref 30.0–36.0)
Neutro Abs: 3.8 10*3/uL (ref 1.7–7.7)
Neutrophils Relative %: 63 % (ref 43–77)
RDW: 14.6 % (ref 11.5–15.5)
WBC: 6.1 10*3/uL (ref 4.0–10.5)

## 2010-11-07 LAB — COMPLETE METABOLIC PANEL WITH GFR
ALT: 24 U/L (ref 0–53)
AST: 29 U/L (ref 0–37)
Albumin: 5 g/dL (ref 3.5–5.2)
Alkaline Phosphatase: 62 U/L (ref 39–117)
Potassium: 4.1 mEq/L (ref 3.5–5.3)
Sodium: 141 mEq/L (ref 135–145)
Total Protein: 7.8 g/dL (ref 6.0–8.3)

## 2010-11-07 LAB — IRON: Iron: 63 ug/dL (ref 42–165)

## 2010-11-07 MED ORDER — CYANOCOBALAMIN 1000 MCG/ML IJ SOLN
1000.0000 ug | INTRAMUSCULAR | Status: DC
  Administered 2010-11-07: 1000 ug via INTRAMUSCULAR

## 2010-11-07 MED ORDER — LEVOTHYROXINE SODIUM 50 MCG PO TABS: 50.0000 ug | ORAL_TABLET | Freq: Every day | ORAL | Status: DC

## 2010-11-07 NOTE — Telephone Encounter (Signed)
Pt aware of the above  

## 2010-11-07 NOTE — Telephone Encounter (Signed)
Pls fax copy of labs to Dr Ezzard Standing at [facility]. Pls let pt know that  His blood counts and iron levels came back normal.  His sugar, liver and kidney function look perfect.  His B12 is LOW and his is still a little HYPOthyroid.  Since he has FATIGUE, let's go ahead and start him on B12 shots monthly in addition to his regular bariatric supplements.  I will also add Synthroid  50 mcg once daily for his thyroid.  Repeat B12 level and TSH in 3 mos.

## 2010-11-07 NOTE — Progress Notes (Signed)
  Subjective:    Patient ID: Casey Fletcher, male    DOB: 16-Jun-1976, 35 y.o.   MRN: [id number]  HPIcc   Review of Systems     Objective:   Physical Exam        Assessment & Plan:

## 2010-11-10 LAB — VITAMIN B1: Vitamin B1 (Thiamine): 18 nmol/L (ref 9–44)

## 2010-11-20 ENCOUNTER — Ambulatory Visit: Payer: BC Managed Care – PPO | Admitting: *Deleted

## 2010-12-08 ENCOUNTER — Ambulatory Visit (INDEPENDENT_AMBULATORY_CARE_PROVIDER_SITE_OTHER): Payer: BC Managed Care – PPO | Admitting: Family Medicine

## 2010-12-08 DIAGNOSIS — E538 Deficiency of other specified B group vitamins: Secondary | ICD-10-CM

## 2010-12-08 MED ORDER — CYANOCOBALAMIN 1000 MCG/ML IJ SOLN
1000.0000 ug | INTRAMUSCULAR | Status: DC
  Administered 2010-12-08: 1000 ug via INTRAMUSCULAR

## 2010-12-25 ENCOUNTER — Ambulatory Visit: Payer: BC Managed Care – PPO | Admitting: *Deleted

## 2011-01-04 ENCOUNTER — Ambulatory Visit: Payer: BC Managed Care – PPO

## 2011-01-12 ENCOUNTER — Ambulatory Visit (INDEPENDENT_AMBULATORY_CARE_PROVIDER_SITE_OTHER): Payer: BC Managed Care – PPO | Admitting: Family Medicine

## 2011-01-12 ENCOUNTER — Encounter: Payer: Self-pay | Admitting: Family Medicine

## 2011-01-12 DIAGNOSIS — E538 Deficiency of other specified B group vitamins: Secondary | ICD-10-CM

## 2011-01-12 DIAGNOSIS — J00 Acute nasopharyngitis [common cold]: Secondary | ICD-10-CM

## 2011-01-12 MED ORDER — HYDROCODONE-HOMATROPINE 5-1.5 MG/5ML PO SYRP: 5.0000 mL | ORAL_SOLUTION | Freq: Every evening | ORAL | Status: AC | PRN

## 2011-01-12 NOTE — Patient Instructions (Signed)
For cold:  Use Delsym during the day for cough and Hycodan at night.  OK to use Sudafed (get from behind the pharmacy counter) for congestion. Use tylenol as needed for aches and pain.  Search for a MVI and iron to take (chewable to liquid, Bariatric vitamin websites).  Stay on calcium with D.  Repeat B12 shot in 1 month, then check level.

## 2011-01-12 NOTE — Progress Notes (Signed)
  Subjective:    Patient ID: Casey Fletcher, male    DOB: Jun 13, 1976, 35 y.o.   MRN: [id number]  HPI  Cough, runny nose and sore throat x 5 days.  He has had a low grade fever and fatigue.  No ear pain or sinus pressure.  He is coughing up a little phlegm.  No chest tightness or SOB.  Not taking anything OTC.  No one else sick.  No diarrhea or nausea.  Not taking anything.  Had RYGB in 2011 and isn't sure what he can take.    BP 125/80  Pulse 104  Temp(Src) 98 F (36.7 C) (Oral)  Ht 6\' 4"  (1.93 m)  Wt 269 lb (122.018 kg)  BMI 32.74 kg/m2  SpO2 100%    Review of Systems  Constitutional: Positive for fever, chills and fatigue. Negative for appetite change.  HENT: Positive for congestion, sore throat, rhinorrhea and sneezing. Negative for sinus pressure.   Eyes: Negative for discharge.  Respiratory: Positive for cough. Negative for chest tightness and shortness of breath.   Gastrointestinal: Negative for nausea, abdominal pain and diarrhea.  Skin: Negative for rash.  Neurological: Negative for headaches.       Objective:   Physical Exam  Constitutional: He appears well-developed and well-nourished.  HENT:  Right Ear: External ear normal.  Left Ear: External ear normal.  Mouth/Throat: Oropharynx is clear and moist.       Nasal congestion, slightly injected o/p with postnasal drip  Eyes: Conjunctivae are normal.  Neck: Neck supple.  Cardiovascular: Normal rate, regular rhythm and normal heart sounds.   Pulmonary/Chest: Effort normal and breath sounds normal. No respiratory distress. He has no wheezes.  Lymphadenopathy:    He has cervical adenopathy.          Assessment & Plan:

## 2011-01-14 DIAGNOSIS — J00 Acute nasopharyngitis [common cold]: Secondary | ICD-10-CM | POA: Insufficient documentation

## 2011-01-14 NOTE — Assessment & Plan Note (Signed)
REassured pt that his symptoms were c/w a viral nasopharyngitis.  Supportive care measures to be taken with use of Hycodan at night for cough and delsym during the day.  Ok to use tylenol for aches/ pains.  Rest, hydrate well and use sudafed prn for congestion.  Call if not improved after 10 days.

## 2011-02-09 ENCOUNTER — Ambulatory Visit (INDEPENDENT_AMBULATORY_CARE_PROVIDER_SITE_OTHER): Payer: BC Managed Care – PPO | Admitting: Family Medicine

## 2011-02-09 DIAGNOSIS — E538 Deficiency of other specified B group vitamins: Secondary | ICD-10-CM

## 2011-02-09 MED ORDER — CYANOCOBALAMIN 1000 MCG/ML IJ SOLN
1000.0000 ug | INTRAMUSCULAR | Status: DC
  Administered 2011-02-09 – 2011-04-11 (×3): 1000 ug via INTRAMUSCULAR

## 2011-03-06 ENCOUNTER — Encounter (INDEPENDENT_AMBULATORY_CARE_PROVIDER_SITE_OTHER): Payer: Self-pay | Admitting: General Surgery

## 2011-03-14 ENCOUNTER — Ambulatory Visit (INDEPENDENT_AMBULATORY_CARE_PROVIDER_SITE_OTHER): Payer: BC Managed Care – PPO | Admitting: Family Medicine

## 2011-03-14 VITALS — BP 118/79 | HR 80 | Ht 77.0 in | Wt 262.0 lb

## 2011-03-14 DIAGNOSIS — E538 Deficiency of other specified B group vitamins: Secondary | ICD-10-CM

## 2011-03-14 DIAGNOSIS — Z9884 Bariatric surgery status: Secondary | ICD-10-CM

## 2011-03-14 NOTE — Progress Notes (Signed)
  Subjective:    Patient ID: Casey Fletcher, male    DOB: [birth year], 35 y.o.   MRN: [id number]  HPI need for vit b12 imj    Review of Systems     Objective:   Physical Exam        Assessment & Plan:

## 2011-03-26 ENCOUNTER — Inpatient Hospital Stay: Admission: RE | Admit: 2011-03-26 | Payer: BC Managed Care – PPO | Source: Ambulatory Visit | Admitting: Family Medicine

## 2011-04-11 ENCOUNTER — Ambulatory Visit (INDEPENDENT_AMBULATORY_CARE_PROVIDER_SITE_OTHER): Payer: BC Managed Care – PPO | Admitting: Family Medicine

## 2011-04-11 DIAGNOSIS — E538 Deficiency of other specified B group vitamins: Secondary | ICD-10-CM

## 2011-04-11 DIAGNOSIS — Z9884 Bariatric surgery status: Secondary | ICD-10-CM

## 2011-04-11 NOTE — Progress Notes (Signed)
  Subjective:    Patient ID: Casey Fletcher, male    DOB: [birth year], 35 y.o.   MRN: [id number]  HPI  Need for B12 inj   Review of Systems     Objective:   Physical Exam        Assessment & Plan:

## 2011-04-26 ENCOUNTER — Other Ambulatory Visit: Payer: Self-pay | Admitting: *Deleted

## 2011-05-07 ENCOUNTER — Emergency Department (HOSPITAL_COMMUNITY): Payer: BC Managed Care – PPO

## 2011-05-07 ENCOUNTER — Inpatient Hospital Stay (HOSPITAL_COMMUNITY)
Admission: EM | Admit: 2011-05-07 | Discharge: 2011-05-13 | DRG: 493 | Disposition: A | Discharge status: Home / Self Care | Payer: BC Managed Care – PPO | Attending: General Surgery | Admitting: General Surgery

## 2011-05-07 DIAGNOSIS — E785 Hyperlipidemia, unspecified: Secondary | ICD-10-CM | POA: Diagnosis present

## 2011-05-07 DIAGNOSIS — E860 Dehydration: Secondary | ICD-10-CM | POA: Diagnosis not present

## 2011-05-07 DIAGNOSIS — K439 Ventral hernia without obstruction or gangrene: Secondary | ICD-10-CM | POA: Diagnosis present

## 2011-05-07 DIAGNOSIS — R55 Syncope and collapse: Secondary | ICD-10-CM | POA: Diagnosis not present

## 2011-05-07 DIAGNOSIS — R42 Dizziness and giddiness: Secondary | ICD-10-CM | POA: Diagnosis not present

## 2011-05-07 DIAGNOSIS — K8010 Calculus of gallbladder with chronic cholecystitis without obstruction: Secondary | ICD-10-CM | POA: Diagnosis present

## 2011-05-07 DIAGNOSIS — IMO0002 Reserved for concepts with insufficient information to code with codable children: Secondary | ICD-10-CM | POA: Diagnosis not present

## 2011-05-07 DIAGNOSIS — R109 Unspecified abdominal pain: Secondary | ICD-10-CM | POA: Diagnosis present

## 2011-05-07 DIAGNOSIS — Y836 Removal of other organ (partial) (total) as the cause of abnormal reaction of the patient, or of later complication, without mention of misadventure at the time of the procedure: Secondary | ICD-10-CM | POA: Diagnosis not present

## 2011-05-07 DIAGNOSIS — K8020 Calculus of gallbladder without cholecystitis without obstruction: Principal | ICD-10-CM | POA: Diagnosis present

## 2011-05-07 LAB — CBC
MCH: 29.6 pg (ref 26.0–34.0)
MCHC: 31.6 g/dL (ref 30.0–36.0)
Platelets: 205 10*3/uL (ref 150–400)
RBC: 4.67 MIL/uL (ref 4.22–5.81)

## 2011-05-07 LAB — DIFFERENTIAL
Basophils Relative: 0 % (ref 0–1)
Eosinophils Absolute: 0.2 10*3/uL (ref 0.0–0.7)
Monocytes Relative: 6 % (ref 3–12)
Neutrophils Relative %: 62 % (ref 43–77)

## 2011-05-07 LAB — COMPREHENSIVE METABOLIC PANEL
ALT: 16 U/L (ref 0–53)
AST: 19 U/L (ref 0–37)
CO2: 30 mEq/L (ref 19–32)
Calcium: 9.6 mg/dL (ref 8.4–10.5)
Sodium: 140 mEq/L (ref 135–145)
Total Protein: 7.2 g/dL (ref 6.0–8.3)

## 2011-05-07 LAB — URINALYSIS, ROUTINE W REFLEX MICROSCOPIC
Glucose, UA: NEGATIVE mg/dL
Hgb urine dipstick: NEGATIVE
Specific Gravity, Urine: 1.026 (ref 1.005–1.030)

## 2011-05-07 LAB — POCT I-STAT TROPONIN I: Troponin i, poc: 0.01 ng/mL (ref 0.00–0.08)

## 2011-05-08 ENCOUNTER — Inpatient Hospital Stay (HOSPITAL_COMMUNITY): Payer: BC Managed Care – PPO

## 2011-05-08 ENCOUNTER — Other Ambulatory Visit (INDEPENDENT_AMBULATORY_CARE_PROVIDER_SITE_OTHER): Payer: Self-pay | Admitting: General Surgery

## 2011-05-08 HISTORY — PX: CHOLECYSTECTOMY: SHX55

## 2011-05-08 LAB — CBC
HCT: 41.9 % (ref 39.0–52.0)
MCV: 92.9 fL (ref 78.0–100.0)
RBC: 4.51 MIL/uL (ref 4.22–5.81)
WBC: 4.5 10*3/uL (ref 4.0–10.5)

## 2011-05-08 LAB — COMPREHENSIVE METABOLIC PANEL
AST: 14 U/L (ref 0–37)
BUN: 11 mg/dL (ref 6–23)
CO2: 30 mEq/L (ref 19–32)
Chloride: 105 mEq/L (ref 96–112)
Creatinine, Ser: 0.91 mg/dL (ref 0.50–1.35)
GFR calc non Af Amer: >90 mL/min (ref >90)
Total Bilirubin: 0.7 mg/dL (ref 0.3–1.2)

## 2011-05-08 LAB — MRSA PCR SCREENING: MRSA by PCR: NEGATIVE

## 2011-05-09 ENCOUNTER — Inpatient Hospital Stay (HOSPITAL_COMMUNITY): Payer: BC Managed Care – PPO

## 2011-05-09 DIAGNOSIS — R55 Syncope and collapse: Secondary | ICD-10-CM

## 2011-05-09 LAB — COMPREHENSIVE METABOLIC PANEL
Alkaline Phosphatase: 56 U/L (ref 39–117)
BUN: 10 mg/dL (ref 6–23)
CO2: 26 mEq/L (ref 19–32)
Chloride: 99 mEq/L (ref 96–112)
GFR calc Af Amer: >90 mL/min (ref >90)
GFR calc non Af Amer: >90 mL/min (ref >90)
Glucose, Bld: 158 mg/dL — ABNORMAL HIGH (ref 70–99)
Potassium: 4 mEq/L (ref 3.5–5.1)
Total Bilirubin: 0.6 mg/dL (ref 0.3–1.2)
Total Protein: 6.1 g/dL (ref 6.0–8.3)

## 2011-05-09 LAB — CBC
HCT: 33.3 % — ABNORMAL LOW (ref 39.0–52.0)
Hemoglobin: 10.6 g/dL — ABNORMAL LOW (ref 13.0–17.0)
MCHC: 31.8 g/dL (ref 30.0–36.0)
RBC: 3.59 MIL/uL — ABNORMAL LOW (ref 4.22–5.81)

## 2011-05-09 LAB — GLUCOSE, CAPILLARY: Glucose-Capillary: 146 mg/dL — ABNORMAL HIGH (ref 70–99)

## 2011-05-09 NOTE — Op Note (Signed)
Casey Fletcher, [name] NO.:  [id number]  MEDICAL RECORD NO.:  [id number]  LOCATION:  1530                         FACILITY:  [facility]  PHYSICIAN:  Casey Fletcher, MDDATE OF BIRTH:  1976-05-15  DATE OF PROCEDURE: DATE OF DISCHARGE:                              OPERATIVE REPORT   PREOPERATIVE DIAGNOSIS:  Abdominal pain and gallstones, status post gastric bypass.  POSTOPERATIVE DIAGNOSIS:  Abdominal pain and gallstones, status post gastric bypass.  PROCEDURE: 1. Laparoscopic cholecystectomy with intraoperative cholangiogram. 2. Diagnostic laparoscopy.  SURGEON:  Casey Fletcher. Casey Fletcher, M.D.  ASSISTANT:  Casey Fletcher, M.D.  ANESTHESIA:  General tracheal.  SPECIMENS:  Gallbladder pathology.  FINDINGS:  Chronic cholecystitis without internal hernia.  DISPOSITION:  The patient to recovery in stable condition.  INDICATIONS:  This is a 35 year old male with a history of a gastric bypass who has developed more diffuse and left-sided abdominal pain.  He does have gallstones that have shown up.  We discussed a laparoscopic cholecystectomy due to the location of his pain even though all the rest of his studies including a CT scan and EGD were essentially negative. Dr. Johna Fletcher assisted to ensure that to evaluate for an internal hernia. We discussed the risks and benefits of the operation.  DETAILS OF PROCEDURE:  After informed consent, the patient was taken the operating room.  He was administered ciprofloxacin on the floor.  He had sequential compression devices placed on lower extremities.  He was then placed under general endotracheal anesthesia without complication.  His abdomen was prepped and draped in standard sterile surgical fashion. Surgical time out was then performed.   I infiltrated 0.25% Marcaine below his umbilicus made a vertical incision.  I grasped his umbilical stalk.  I entered his fascia sharply.  His peritoneum was  entered bluntly.  I placed a 0-Vicryl pursestring suture through the fascia.  I then introduced 3 other 5 mm trocars under direct vision after infiltration of local anesthetic without complication in the epigastrium and right upper quadrant.  The gallbladder was noted to have chronic cholecystitis.  He had some omentum that was adherent to his liver  into his gallbladder.  This was cut away from his liver sharply using some judicious cautery as well.  Eventually the gallbladder was able to retract cephalad and lateral.  We then dissected the triangle of Calot and obtained a critical view of safety.  I placed a clip distal on the cystic duct.  I made a ductotomy.  I introduced a Cook catheter and performed a cholangiogram.  The cholangiogram showed that we were in the cystic duct, both sides of the liver filling, flow into the duodenum, and no evidence of any stones.  I then removed the catheter.  I clipped the duct 3 times and divided it.  I treated the artery in a similar fashion.  The gallbladder was then removed from the liver bed.  This is clearly evidence of chronic cholecystitis.  This eventually was removed and this was placed in an EndoCatch bag removed from the umbilicus passed off the table as a specimen.  I then obtained hemostasis and we irrigated until this was clear.  Dr. Johna Fletcher then ran the bowel from the  gastrojejunostomy through the jejunojejunostomy all the way to the ileocecal valve.  There was no evidence of an internal hernia or any abnormality noted in the bowel at all.  Once Dr. Johna Fletcher completed this we then removed the umbilical trocar.  I tied this down. I viewed this from the epigastric incision.  There was no evidence of an entry injury.  This completely obliterated the defect.  The remainder of the trocars were removed after the abdomen was desufflated.  The incisions were closed with 4-0 Monocryl and Dermabond.  He tolerated this well, was extubated in the  operating room, and transferred to recovery room in stable condition.     Casey Gosling, MD     MCW/MEDQ  D:  05/08/2011  T:  05/09/2011  Job:  [id number]  Electronically Signed by Emelia Loron MD on 05/09/2011 03:48:38 PM

## 2011-05-10 DIAGNOSIS — D649 Anemia, unspecified: Secondary | ICD-10-CM

## 2011-05-10 LAB — GLUCOSE, CAPILLARY: Glucose-Capillary: 85 mg/dL (ref 70–99)

## 2011-05-10 LAB — CBC
HCT: 25.7 % — ABNORMAL LOW (ref 39.0–52.0)
Hemoglobin: 8.3 g/dL — ABNORMAL LOW (ref 13.0–17.0)
MCHC: 32.6 g/dL (ref 30.0–36.0)
Platelets: 193 10*3/uL (ref 150–400)
Platelets: 192 10*3/uL (ref 150–400)
RBC: 2.87 MIL/uL — ABNORMAL LOW (ref 4.22–5.81)
RDW: 13 % (ref 11.5–15.5)
RDW: 13 % (ref 11.5–15.5)
WBC: 6.7 10*3/uL (ref 4.0–10.5)
WBC: 7.7 10*3/uL (ref 4.0–10.5)
WBC: 6.6 10*3/uL (ref 4.0–10.5)

## 2011-05-10 LAB — APTT: aPTT: 38 seconds — ABNORMAL HIGH (ref 24–37)

## 2011-05-10 LAB — PROTIME-INR
INR: 1.09 (ref 0.00–1.49)
Prothrombin Time: 14.3 seconds (ref 11.6–15.2)

## 2011-05-11 ENCOUNTER — Inpatient Hospital Stay (HOSPITAL_COMMUNITY): Payer: BC Managed Care – PPO

## 2011-05-11 ENCOUNTER — Ambulatory Visit: Payer: BC Managed Care – PPO

## 2011-05-11 LAB — CBC
HCT: 24.2 % — ABNORMAL LOW (ref 39.0–52.0)
HCT: 28.7 % — ABNORMAL LOW (ref 39.0–52.0)
Hemoglobin: 7.4 g/dL — ABNORMAL LOW (ref 13.0–17.0)
MCH: 29.6 pg (ref 26.0–34.0)
MCH: 29.8 pg (ref 26.0–34.0)
MCHC: 31.8 g/dL (ref 30.0–36.0)
MCHC: 32.2 g/dL (ref 30.0–36.0)
MCHC: 32.4 g/dL (ref 30.0–36.0)
MCV: 93.1 fL (ref 78.0–100.0)
Platelets: 166 10*3/uL (ref 150–400)
Platelets: 201 10*3/uL (ref 150–400)
RDW: 13.3 % (ref 11.5–15.5)
RDW: 13.1 % (ref 11.5–15.5)
RDW: 13.4 % (ref 11.5–15.5)

## 2011-05-11 LAB — COMPREHENSIVE METABOLIC PANEL
ALT: 13 U/L (ref 0–53)
AST: 15 U/L (ref 0–37)
Albumin: 3.3 g/dL — ABNORMAL LOW (ref 3.5–5.2)
Alkaline Phosphatase: 48 U/L (ref 39–117)
CO2: 30 mEq/L (ref 19–32)
Chloride: 105 mEq/L (ref 96–112)
GFR calc non Af Amer: >90 mL/min (ref >90)
Potassium: 3.7 mEq/L (ref 3.5–5.1)
Total Bilirubin: 0.5 mg/dL (ref 0.3–1.2)

## 2011-05-11 LAB — PREPARE RBC (CROSSMATCH)

## 2011-05-12 LAB — TYPE AND SCREEN
Antibody Screen: POSITIVE
DAT, IgG: NEGATIVE

## 2011-05-12 LAB — BASIC METABOLIC PANEL
CO2: 28 mEq/L (ref 19–32)
Calcium: 9.2 mg/dL (ref 8.4–10.5)
GFR calc non Af Amer: >90 mL/min (ref >90)
Potassium: 3.7 mEq/L (ref 3.5–5.1)
Sodium: 136 mEq/L (ref 135–145)

## 2011-05-12 LAB — CBC
MCH: 29.3 pg (ref 26.0–34.0)
MCHC: 32 g/dL (ref 30.0–36.0)
Platelets: 177 10*3/uL (ref 150–400)
RBC: 2.97 MIL/uL — ABNORMAL LOW (ref 4.22–5.81)

## 2011-05-13 LAB — HEMOGLOBIN AND HEMATOCRIT, BLOOD
HCT: 29.4 % — ABNORMAL LOW (ref 39.0–52.0)
Hemoglobin: 9.3 g/dL — ABNORMAL LOW (ref 13.0–17.0)

## 2011-05-14 DIAGNOSIS — Z8719 Personal history of other diseases of the digestive system: Secondary | ICD-10-CM | POA: Insufficient documentation

## 2011-05-14 DIAGNOSIS — D509 Iron deficiency anemia, unspecified: Secondary | ICD-10-CM | POA: Insufficient documentation

## 2011-05-21 ENCOUNTER — Encounter (INDEPENDENT_AMBULATORY_CARE_PROVIDER_SITE_OTHER): Payer: Self-pay | Admitting: General Surgery

## 2011-05-21 ENCOUNTER — Ambulatory Visit (INDEPENDENT_AMBULATORY_CARE_PROVIDER_SITE_OTHER): Payer: BC Managed Care – PPO | Admitting: General Surgery

## 2011-05-21 VITALS — BP 126/74 | HR 68 | Temp 97.4°F | Resp 20 | Ht 77.0 in | Wt 252.2 lb

## 2011-05-21 DIAGNOSIS — Z09 Encounter for follow-up examination after completed treatment for conditions other than malignant neoplasm: Secondary | ICD-10-CM

## 2011-05-21 LAB — COMPREHENSIVE METABOLIC PANEL
AST: 19 U/L (ref 0–37)
Albumin: 4.4 g/dL (ref 3.5–5.2)
BUN: 11 mg/dL (ref 6–23)
Calcium: 10 mg/dL (ref 8.4–10.5)
Chloride: 104 mEq/L (ref 96–112)
Potassium: 4.7 mEq/L (ref 3.5–5.3)

## 2011-05-21 LAB — CBC
HCT: 40.3 % (ref 39.0–52.0)
Hemoglobin: 12.7 g/dL — ABNORMAL LOW (ref 13.0–17.0)
RDW: 14.8 % (ref 11.5–15.5)
WBC: 6.1 10*3/uL (ref 4.0–10.5)

## 2011-05-21 MED ORDER — PROMETHAZINE HCL 12.5 MG PO TABS: 12.5000 mg | ORAL_TABLET | Freq: Four times a day (QID) | ORAL | Status: DC | PRN

## 2011-05-21 NOTE — H&P (Signed)
NAMEWilma Fletcher         ACCOUNT NO.:  [id number]  MEDICAL RECORD NO.:  [id number]  LOCATION:  WLED                         FACILITY:  [facility]  PHYSICIAN:  Dr. Silva Fletcher        DATE OF BIRTH:  [birth year]  DATE OF ADMISSION:  05/07/2011 DATE OF DISCHARGE:                             HISTORY & PHYSICAL   REFERRING PHYSICIAN:  Rolan Bucco, MD  CHIEF COMPLAINT:  Abdominal pain.  BRIEF HISTORY:  The patient is a 35 year old male, who underwent a laparoscopic Roux-en-Y gastric bypass, along with the EGD, July 24, 2010, by Dr. Glenna Fletcher.  He has been doing well.  He has had a 122-pound weight loss.  His diabetes and hypertension resolved.  He is currently on no medicines.  Approximately 4 weeks ago, he started to have an abdominal pain, initially was after eating, he would have nausea and vomiting.  Then it would get better.  Recently, he has had progressive pain.  He was seen by Dr. Janae Fletcher of [facility] in [place], [place].  It was set up, underwent a colonoscopy on April 30, 2011, at which time, some colon polyps were removed.  He then underwent EGD May 03, 2011, which was negative.  They followed this up with a CT scan of the abdomen and pelvis, May 04, 2011. This was also done in [place].  He was found to have gallstones, but no other abnormalities.  He was actually scheduled for a HIDA scan later this week.  Since going home on Friday, he has had ongoing pain. The pain is constant and persistent.  He has not been able to eat.  He has had ongoing nausea, vomiting, and diarrhea over the weekend.  He called his primary care and reported he was coming to the ER for evaluation.  He knows there is something wrong.  He has not been able to work for the last 3 to 4 weeks.  PAST MEDICAL HISTORY: 1. Morbid obesity, status post Roux-en-Y gastric bypass with EGD     July 24, 2010.  That weight at surgery was 370, is down to  278     now. 2. New onset diabetes mellitus, now resolved after the bypass.  He is     on no medications. 3. Hypertension, also resolved after surgery.  No medicines. 4. Reported dyslipidemia.  The patient is unaware of that. 5. He knows he has some vitamin deficiency.  He thinks B12 is one of     them, he is not sure. 6. There is a question of abnormal thyroid, although he is currently     on no medicines.  PAST SURGICAL HISTORY: 1. Laparoscopic Roux-en-Y gastric bypass/EGD, July 24, 2010, Dr.     Johna Fletcher. 2. Appendectomy, 1990.  FAMILY HISTORY:  Father is living at 28, in good health.  Mother is living with some thyroid problems.  One brother 19, with cancer of thyroid.  He spent four tours in [place], was wounded in his last tour.  He has 3 sisters, one with thyroid issues, two in good health.  SOCIAL HISTORY:  Tobacco, never.  Alcohol, none.  Drugs, none.  He works at [employer].  He is  married and has one daughter.  REVIEW OF SYSTEMS:  He has had some fevers, not documented currently. Weight loss 122 pounds since January 2012.  SKIN:  No changes.  GI: Negative for GERD.  Positive for nausea and vomiting on and off for the last 4 weeks.  He had diarrhea on and off for the last 5 days.  No constipation.  No blood in his stool.  CARDIAC:  No chest pain or palpitations.  PULMONARY:  No orthopnea.  No PND.  No dyspnea on exertion.  GU:  Negative.  LOWER EXTREMITIES:  No edema or claudication. PSYCHIATRIC:  No changes.  CURRENT HOME MEDICATIONS:  None.  ALLERGIES:  None.  PHYSICAL EXAMINATION:  GENERAL:  This is a well-nourished, well- developed Hispanic male, in no acute distress. VITAL SIGNS:  Temperature is 98.4, heart rate is 72, blood pressure is 130/85, respiratory rate is 18, saturations 100% on room air. HEAD:  Normocephalic. EARS, NOSE, THROAT, AND MOUTH:  Grossly within normal limits. NECK:  Trachea is in midline.  There is no bruits.  No JVD.  Thyroid  is nonpalpable. CHEST:  Nontender to palpation.  Chest is clear to auscultation. CARDIAC:  Normal S1, S2.  No murmurs or rubs.  Pulses are +2 in the upper and lower extremities. ABDOMEN:  Bowel sounds are present.  He is not distended, but he is tender and is pretty much diffuse all over.  Hernia none.  Masses none. Abscesses none. GU/RECTAL:  Deferred. LYMPHADENOPATHY:  None palpated, cervical, axillary, or femoral. SKIN:  No changes. MUSCULOSKELETAL:  No changes. NEUROLOGIC:  No focal changes.  Cranial nerves grossly intact. PSYCHIATRIC: Normal affect.  LABORATORY DATA:  UA is negative.  Lipase is 22.  Sodium is 140. Potassium is 4.0.  Chloride is 103.  CO2 is 30.  BUN is 15.  Creatinine is 0.83.  Glucose is 77.  Total bilirubin 0.6.  Alkaline phosphatase 56. SGOT is 19.  SGPT is 16.  White count is 6.2, hemoglobin is 13.8, hematocrit 43.7, platelets 205,000.  Troponin is 0.01.  Ultrasound of the abdomen shows multiple stones. Lumen of the gallbladder, 5-10 mm in size.  There is no gallbladder wall thickening.  The wall was about 3 mm.  There is no pericholecystic fluid.  No stranding.  Murphy sign was negative.  IMPRESSION: 1. Cholelithiasis, probable cholecystitis. 2. History of Roux-en-Y gastric bypass, July 24, 2010, with 122-     pound weight loss. 3. Adult-onset diabetes mellitus, resolved. 4. Hypertension, resolved. 5. Status post colonoscopy in [place], April 30, 2011, with     polyp removal.  Status post EGD May 03, 2011, which is reported     negative.  CT scan of the abdomen and pelvis, May 04, 2011, in     [place], which was reported negative except for stones.  PLAN:  I am going to start him on Cipro.  We will treat his pain and his nausea, hydrating and get the records from [place], tentatively schedule him for elective cholecystectomy tomorrow, pending confirmation in the information about his colonoscopy, EGD, and CT  scan.     Casey Fletcher, P.A.     WDJ/MEDQ  D:  05/07/2011  T:  05/07/2011  Job:  [id number]  cc:   Dr. Charlyne Fletcher  Electronically Signed by Casey Fletcher P.A. on 05/12/2011 03:49:00 PM Electronically Signed by Emelia Loron MD on 05/21/2011 01:23:50 PM

## 2011-05-21 NOTE — Progress Notes (Signed)
Subjective:     Patient ID: Casey Fletcher, male   DOB: 1975-08-21, 35 y.o.   MRN: [id number]  HPI This is a 38 yom s/p gastric bypass who presented with abdominal pain and underwent diagnostic lsc which ruled out internal hernia and cholecystectomy for chronic cholecystitis and cholelithiasis.  He had bleeding postop and stabilized and was eventually discharged.  He comes in today with weakness, not able to tolerate all meds.    Review of Systems     Objective:   Physical Exam    healed incisions with no infection, soft, nontender Assessment:     S/p lap chole    Plan:     I think he is doing as expected.  He has some nausea and is not taking his vitamins or iron.  I will check his labs today to make sure it is OK.  I will also give rx for phenergan today due to nausea.  Will follow up after labs and will see back on 26th and release to work following day if improved.

## 2011-05-22 ENCOUNTER — Telehealth (INDEPENDENT_AMBULATORY_CARE_PROVIDER_SITE_OTHER): Payer: Self-pay

## 2011-05-22 DIAGNOSIS — Z9884 Bariatric surgery status: Secondary | ICD-10-CM

## 2011-05-22 NOTE — Telephone Encounter (Signed)
Called pt to notify him of his lab results that were drawn yesterday. Pt notified that his total bilirubin was elevated so Dr Dwain Sarna wants him to have this repeated next wk. I advised the pt that I would mail him the lab request so he can get them drawn at [facility] in [place]. I asked how the pt felt today and he still doesn't feel that good he really feels weak. The pt is keeping food and fluid down for the time being and doesn't really want to come into the office again yet. The pt said he would call me back tomorrow if he wakes up still not feeling any better b/c Dr Dwain Sarna did recommend for the pt to see one of his partners in urgent office this wk. The pt understands the info./ [facility]

## 2011-05-29 LAB — COMPREHENSIVE METABOLIC PANEL
AST: 16 U/L (ref 0–37)
Alkaline Phosphatase: 73 U/L (ref 39–117)
BUN: 15 mg/dL (ref 6–23)
Creat: 0.82 mg/dL (ref 0.50–1.35)
Potassium: 4 mEq/L (ref 3.5–5.3)
Total Bilirubin: 0.7 mg/dL (ref 0.3–1.2)

## 2011-06-11 ENCOUNTER — Encounter (INDEPENDENT_AMBULATORY_CARE_PROVIDER_SITE_OTHER): Payer: Self-pay | Admitting: General Surgery

## 2011-06-11 ENCOUNTER — Ambulatory Visit (INDEPENDENT_AMBULATORY_CARE_PROVIDER_SITE_OTHER): Payer: BC Managed Care – PPO | Admitting: General Surgery

## 2011-06-11 VITALS — BP 126/82 | HR 60 | Temp 98.2°F | Resp 16 | Ht 77.4 in | Wt 259.5 lb

## 2011-06-11 DIAGNOSIS — Z09 Encounter for follow-up examination after completed treatment for conditions other than malignant neoplasm: Secondary | ICD-10-CM

## 2011-06-11 NOTE — Progress Notes (Signed)
Subjective:     Patient ID: Casey Fletcher, male   DOB: [birth year], 35 y.o.   MRN: [id number]  HPI This is a 35 year old male who underwent a gastric bypass previously who then had cholecystitis. I did a laparoscopic cholecystectomy on him. This was complicated by hematoma which eventually resolved on its own. Dr. Johna Sheriff assisted me to make sure there was no problem with his bypass and he had no evidence of an internal hernia at the time of surgery. He has been slow to improve but he is doing better today. Most of his symptoms now are occurring in his left upper quadrant he reports some nausea and as she has had an episode of emesis as well. He is being evaluated by gastroenterology right now to look at this. He does state that he said several test they have all been fairly normal at this point. He is due to get what sounds like either MRI or CT scan next week. He may very well need an endoscopy. Otherwise he is doing fairly well from his surgery I would have been followed by Dr. Johna Sheriff in 2 weeks for his bypass.  Review of Systems     Objective:   Physical Exam Well healed incisions without infection, soft, nontender    Assessment:     S/p lap chole  LUQ pain    Plan:        He is  doing well from his gallbladder standpoint. I'm concerned and he had some issues with his bypass. He was undergoing an evaluation with saline surgical right now. I'm going to await those results. I'm going to get him back and see Dr. Johna Sheriff in about 2 weeks as well.

## 2011-06-13 ENCOUNTER — Telehealth (INDEPENDENT_AMBULATORY_CARE_PROVIDER_SITE_OTHER): Payer: Self-pay

## 2011-06-13 NOTE — Telephone Encounter (Signed)
I received a call from [facility] stating they saw this pt due to llq pain and nausea.  They did a HIDA today and it showed a fluid collection s/p cholecystectomy.  They are planning a ct guided drainage of the fluid.  It did not show a bile leak.  They want to refer him back to see Dr Dwain Sarna.  I noted he just saw him Monday and was doing well.  I paged Dr Dwain Sarna.  He is not sure how they see a fluid collection on the HIDA and would not recommend the drainage.  He thinks he should talk to someone who is managing this and they should contact him.  Otherwise he can't advise on the information given.  The pt was doing well Monday and has had a ct that was ok.  He had a hematoma po.  I called Asher Muir back and informed her.  She will have her PA call me and I will page Dr Dwain Sarna to call.  They are faxing the notes/reports.

## 2011-06-18 ENCOUNTER — Telehealth (INDEPENDENT_AMBULATORY_CARE_PROVIDER_SITE_OTHER): Payer: Self-pay

## 2011-06-18 NOTE — Telephone Encounter (Signed)
Called patient to give follow up appt. offered 12/20 but, he req'd later date follow up appt.  Pt scheduled for 07/13/11 with Dr. Johna Sheriff.

## 2011-07-05 ENCOUNTER — Ambulatory Visit (INDEPENDENT_AMBULATORY_CARE_PROVIDER_SITE_OTHER): Payer: BC Managed Care – PPO | Admitting: General Surgery

## 2011-07-06 ENCOUNTER — Ambulatory Visit (INDEPENDENT_AMBULATORY_CARE_PROVIDER_SITE_OTHER): Payer: BC Managed Care – PPO | Admitting: General Surgery

## 2011-07-06 ENCOUNTER — Encounter (INDEPENDENT_AMBULATORY_CARE_PROVIDER_SITE_OTHER): Payer: Self-pay | Admitting: General Surgery

## 2011-07-06 VITALS — BP 108/72 | HR 68 | Temp 96.8°F | Resp 16 | Ht 76.0 in | Wt 258.0 lb

## 2011-07-06 DIAGNOSIS — Z09 Encounter for follow-up examination after completed treatment for conditions other than malignant neoplasm: Secondary | ICD-10-CM

## 2011-07-06 NOTE — Progress Notes (Signed)
Patient ID: Casey Fletcher, male   DOB: 1975-09-07, 35 y.o.   MRN: [id number]  Chief Complaint  Patient presents with  . Routine Post Op    lap chole    HPI Casey Fletcher is a 35 y.o. male.   HPI This is a 35 year old male who had a laparoscopic gastric bypass in the past. I met him while I was on call at [facility]. I took him to the operating room and under doing a laparoscopic cholecystectomy as well as evaluation for an internal hernia. This was done in conjunction with Dr. Johna Sheriff. Postoperatively he dropped his hematocrit and he certainly had a fairly significant hematoma. He's been having some intermittent symptoms since then and I thought were mostly related to his hematoma. He was doing better until apparently a couple weeks ago he began having fevers and having increased pain. I am seeing him today in finding out this for the first time. He complains of some nausea and some vomiting. He's not really eating very well right now at all.  Past Medical History  Diagnosis Date  . Obesity   . Hypothyroidism   . Hypertension   . Diabetes mellitus   . Blood transfusion   . Anemia   . Asthma   . Chills   . Cough   . Blood in urine   . Fainting   . Weakness generalized   . Fatigue   . Abdominal pain     Past Surgical History  Procedure Date  . Cardiac catheterization 2005  . Appendectomy 1988  . Gastric bypass 1-12    Dr Johna Sheriff  . Cholecystectomy 05/08/11    Family History  Problem Relation Age of Onset  . Hyperlipidemia Mother   . Hypertension Mother   . Hyperlipidemia Father   . Hypertension Father     Social History History  Substance Use Topics  . Smoking status: Never Smoker   . Smokeless tobacco: Never Used  . Alcohol Use: No    No Known Allergies  Current Outpatient Prescriptions  Medication Sig Dispense Refill  . amitriptyline (ELAVIL) 25 MG tablet Daily.      Marland Kitchen FREESTYLE TEST STRIPS test strip Daily.      . polyethylene glycol powder  (GLYCOLAX/MIRALAX) powder       . promethazine (PHENERGAN) 25 MG tablet        Current Facility-Administered Medications  Medication Dose Route Frequency Provider Last Rate Last Dose  . DISCONTD: cyanocobalamin ((VITAMIN B-12)) injection 1,000 mcg  1,000 mcg Intramuscular Q30 days Seymour Bars, DO   1,000 mcg at 11/07/10 1539  . DISCONTD: cyanocobalamin ((VITAMIN B-12)) injection 1,000 mcg  1,000 mcg Intramuscular Q30 days Seymour Bars, DO   1,000 mcg at 12/08/10 1331  . DISCONTD: cyanocobalamin ((VITAMIN B-12)) injection 1,000 mcg  1,000 mcg Intramuscular Q30 days Seymour Bars, DO   1,000 mcg at 04/11/11 1108    Review of Systems Review of Systems  Constitutional: Positive for fever and chills.  Gastrointestinal: Positive for abdominal pain.    Blood pressure 108/72, pulse 68, temperature 96.8 F (36 C), temperature source Temporal, resp. rate 16, height 6\' 4"  (1.93 m), weight 258 lb (117.028 kg).  Physical Exam Physical Exam  Constitutional: He appears well-developed and well-nourished.  Eyes: No scleral icterus.  Neck: Neck supple.  Cardiovascular: Normal rate, regular rhythm and normal heart sounds.   Pulmonary/Chest: Effort normal and breath sounds normal. He has no wheezes. He has no rales.  Abdominal: Soft. Bowel sounds are normal. He  exhibits no distension. There is tenderness (ruq tenderness).  Lymphadenopathy:    He has no cervical adenopathy.     Assessment    Ab pain  Fevers  Plan      I think he needs to go to ER as he has ab pain s/p cholecystectomy.  Has known fluid collection I thought was hematoma previously.  He now has fevers and chills.  I have discussed with er being evaluated with labs and ct scan.    Cleburne Savini 07/06/2011, 4:00 PM

## 2011-07-12 ENCOUNTER — Telehealth (INDEPENDENT_AMBULATORY_CARE_PROVIDER_SITE_OTHER): Payer: Self-pay | Admitting: General Surgery

## 2011-07-12 NOTE — Telephone Encounter (Signed)
Called pt to check on him Per Dr Dwain Sarna to see how he feeling from Gallbladder surgery. Pt stated that he went to [facility] last Friday and when he left he gotten the Flu. I reschedule pt appt with Dr Johna Sheriff to 07-19-11 for a follow up for gastic bypass sx. Dr Dwain Sarna is aware of pt that he has the Flu

## 2011-07-13 ENCOUNTER — Ambulatory Visit (INDEPENDENT_AMBULATORY_CARE_PROVIDER_SITE_OTHER): Payer: BC Managed Care – PPO | Admitting: General Surgery

## 2011-07-19 ENCOUNTER — Ambulatory Visit (INDEPENDENT_AMBULATORY_CARE_PROVIDER_SITE_OTHER): Payer: BC Managed Care – PPO | Admitting: General Surgery

## 2011-07-20 ENCOUNTER — Telehealth (INDEPENDENT_AMBULATORY_CARE_PROVIDER_SITE_OTHER): Payer: Self-pay

## 2011-07-20 NOTE — Telephone Encounter (Signed)
Patient reports he is sick with the Flu and would need to reschedule his appointment, he states he was seen at [facility] which kept he was then admitted and stayed for 2 nights due to back pain and fluid collection.  Patient is s/p :Lap Chole & Gastric ByPass/RNY.  Patient was given new appointment date & time 07/26/2011 @ 10:45.  Request for medical records has been signed by Dr. Dwain Sarna and mailed to patient, he will sign and forward to [facility].

## 2011-07-24 ENCOUNTER — Telehealth (INDEPENDENT_AMBULATORY_CARE_PROVIDER_SITE_OTHER): Payer: Self-pay

## 2011-07-24 NOTE — Telephone Encounter (Signed)
The pt's wife called to state that the pt has been running fever of 102-103 also has this rash on the abdomen that is raised and itchy. The pt was seen at [facility] on 07-13-11 and sent to be admitted to [facility]. The pt went home on 07-14-11 without being treated though and the pt is feeling a lot worse now. I did speak with Neysa Bonito and Dr Johna Sheriff and he advised for the pt to go to [facility] ER. The wife will try to get him there to the ER. I did notify Dr Dwain Sarna in case if the pt shows up at the [facility] ER/ [facility]

## 2011-07-25 ENCOUNTER — Emergency Department (HOSPITAL_COMMUNITY): Payer: BC Managed Care – PPO

## 2011-07-25 ENCOUNTER — Encounter (HOSPITAL_COMMUNITY): Payer: Self-pay | Admitting: Emergency Medicine

## 2011-07-25 ENCOUNTER — Emergency Department (HOSPITAL_COMMUNITY)
Admission: EM | Admit: 2011-07-25 | Discharge: 2011-07-26 | Disposition: A | Discharge status: Home / Self Care | Payer: BC Managed Care – PPO | Attending: Emergency Medicine | Admitting: Emergency Medicine

## 2011-07-25 ENCOUNTER — Telehealth (INDEPENDENT_AMBULATORY_CARE_PROVIDER_SITE_OTHER): Payer: Self-pay

## 2011-07-25 DIAGNOSIS — J45909 Unspecified asthma, uncomplicated: Secondary | ICD-10-CM | POA: Insufficient documentation

## 2011-07-25 DIAGNOSIS — R10811 Right upper quadrant abdominal tenderness: Secondary | ICD-10-CM | POA: Insufficient documentation

## 2011-07-25 DIAGNOSIS — I10 Essential (primary) hypertension: Secondary | ICD-10-CM | POA: Insufficient documentation

## 2011-07-25 DIAGNOSIS — R109 Unspecified abdominal pain: Secondary | ICD-10-CM | POA: Insufficient documentation

## 2011-07-25 DIAGNOSIS — Z79899 Other long term (current) drug therapy: Secondary | ICD-10-CM | POA: Insufficient documentation

## 2011-07-25 DIAGNOSIS — E039 Hypothyroidism, unspecified: Secondary | ICD-10-CM | POA: Insufficient documentation

## 2011-07-25 DIAGNOSIS — Z9884 Bariatric surgery status: Secondary | ICD-10-CM | POA: Insufficient documentation

## 2011-07-25 DIAGNOSIS — E119 Type 2 diabetes mellitus without complications: Secondary | ICD-10-CM | POA: Insufficient documentation

## 2011-07-25 LAB — URINALYSIS, ROUTINE W REFLEX MICROSCOPIC
Ketones, ur: 15 mg/dL — AB
Leukocytes, UA: NEGATIVE
Nitrite: NEGATIVE
Protein, ur: NEGATIVE mg/dL

## 2011-07-25 LAB — COMPREHENSIVE METABOLIC PANEL
Albumin: 3.9 g/dL (ref 3.5–5.2)
BUN: 15 mg/dL (ref 6–23)
Creatinine, Ser: 0.77 mg/dL (ref 0.50–1.35)
GFR calc Af Amer: >90 mL/min (ref >90)
Glucose, Bld: 61 mg/dL — ABNORMAL LOW (ref 70–99)
Total Bilirubin: 0.3 mg/dL (ref 0.3–1.2)
Total Protein: 7.6 g/dL (ref 6.0–8.3)

## 2011-07-25 LAB — DIFFERENTIAL
Basophils Relative: 0 % (ref 0–1)
Eosinophils Absolute: 0.2 10*3/uL (ref 0.0–0.7)
Monocytes Absolute: 0.5 10*3/uL (ref 0.1–1.0)
Monocytes Relative: 7 % (ref 3–12)

## 2011-07-25 LAB — CBC
HCT: 43.8 % (ref 39.0–52.0)
Hemoglobin: 14.4 g/dL (ref 13.0–17.0)
MCH: 30.3 pg (ref 26.0–34.0)
MCHC: 32.9 g/dL (ref 30.0–36.0)
MCV: 92.2 fL (ref 78.0–100.0)

## 2011-07-25 LAB — LIPASE, BLOOD: Lipase: 27 U/L (ref 11–59)

## 2011-07-25 LAB — GLUCOSE, CAPILLARY

## 2011-07-25 MED ORDER — GLUCOSE 40 % PO GEL
1.0000 | Freq: Once | ORAL | Status: AC
  Administered 2011-07-25: 37.5 g via ORAL

## 2011-07-25 MED ORDER — GLUCOSE 40 % PO GEL
ORAL | Status: AC
  Administered 2011-07-25: 37.5 g via ORAL
  Filled 2011-07-25: qty 1

## 2011-07-25 MED ORDER — ONDANSETRON HCL 4 MG/2ML IJ SOLN
4.0000 mg | Freq: Once | INTRAMUSCULAR | Status: AC
  Administered 2011-07-25: 4 mg via INTRAVENOUS
  Filled 2011-07-25: qty 2

## 2011-07-25 MED ORDER — HYDROMORPHONE HCL PF 1 MG/ML IJ SOLN
1.0000 mg | Freq: Once | INTRAMUSCULAR | Status: AC
  Administered 2011-07-25: 1 mg via INTRAVENOUS
  Filled 2011-07-25: qty 1

## 2011-07-25 MED ORDER — IOHEXOL 300 MG/ML  SOLN
100.0000 mL | Freq: Once | INTRAMUSCULAR | Status: AC | PRN
  Administered 2011-07-25: 100 mL via INTRAVENOUS

## 2011-07-25 MED ORDER — SODIUM CHLORIDE 0.9 % IV SOLN
999.0000 mL | INTRAVENOUS | Status: DC
  Administered 2011-07-25: 999 mL via INTRAVENOUS

## 2011-07-25 MED ORDER — HYDROMORPHONE HCL PF 1 MG/ML IJ SOLN
1.0000 mg | Freq: Once | INTRAMUSCULAR | Status: AC
Start: 2011-07-25 — End: 2011-07-25
  Administered 2011-07-25: 1 mg via INTRAVENOUS
  Filled 2011-07-25: qty 1

## 2011-07-25 MED ORDER — OXYCODONE-ACETAMINOPHEN 5-325 MG PO TABS: 2.0000 | ORAL_TABLET | ORAL | Status: DC | PRN

## 2011-07-25 NOTE — ED Notes (Signed)
Pt presents with complaints of abdominal pain following gallbladder removal 3 weeks ago. Alert and oriented. Breath sounds are clear and bowel sounds are present. Pt states that he has continued to have diarrhea but he also has diarrhea as a result of his gastric bypass. Pt states that he has been unable to eat much due to the pain. Last po intake was earlier today. Pt stated that he felt like his blood sugar was low so instant glucose given.

## 2011-07-25 NOTE — ED Notes (Signed)
Pt st's he had gallbladder surg end of Oct. Has had problems since then.  Was told last week he had a leak and now pain is a lot worse.  C/o pain in right side

## 2011-07-25 NOTE — Consult Note (Addendum)
I have reviewed the CT scan, and the very small amount of fluid present on the patient's CT would not account for the symptoms he has reported.  His WBC is normal and currently he is afebrile.  He can be discharged for follow up with Dr. Dwain Sarna in 2-3 weeks.  Marta Lamas. Gae Bon, MD, FACS [phone] [facility]

## 2011-07-25 NOTE — Telephone Encounter (Signed)
Patient spouse called office this afternoon explaining why [name] was unable to come to [facility] ED to see Dr. Johna Sheriff.  She did report that she will bring him to [facility] around 3 pm today for further evaluation.  Patient wife states he's still disoriented and no energy.  Patient denies experiencing any hallucinations or vomiting, still having back pain.  Dr. Dwain Sarna was notified that patient plans to come to [facility] ED around 3 pm.  Patient is also aware that Dr. Dwain Sarna will be in surgery and the ED will more than likely start his work up, patient will call our office upon arrival to ED and we will notify Dr. Dwain Sarna when Casey Fletcher arrives to ED as well.

## 2011-07-25 NOTE — ED Provider Notes (Signed)
History     CSN: [id number]  Arrival date & time 07/25/11  1507   First MD Initiated Contact with Patient 07/25/11 1653      Chief Complaint  Patient presents with  . Abdominal Pain    (Consider location/radiation/quality/duration/timing/severity/associated sxs/prior treatment) HPI Patient presents emergency room with complaints of abdominal pain and fever. Patient has history of gastric bypass approximately one year ago. The end of October he had a cholecystectomy. Since then he has had been having trouble with abdominal pain and fevers. He was diagnosed 2 weeks ago with a bile leak. Patient has had persistent fever and increasing pain. He called [facility] surgery office and was told to come to the emergency room. I spoke with the surgical PA, Caryn Bee, and they plan on admitting the patient to the hospital. Patient also noticed an urticarial type rash this morning that has improved her Past Medical History  Diagnosis Date  . Obesity   . Hypothyroidism   . Hypertension   . Diabetes mellitus   . Blood transfusion   . Anemia   . Asthma   . Chills   . Cough   . Blood in urine   . Fainting   . Weakness generalized   . Fatigue   . Abdominal pain     Past Surgical History  Procedure Date  . Cardiac catheterization 2005  . Appendectomy 1988  . Gastric bypass 1-12    Dr Johna Sheriff  . Cholecystectomy 05/08/11    Family History  Problem Relation Age of Onset  . Hyperlipidemia Mother   . Hypertension Mother   . Hyperlipidemia Father   . Hypertension Father     History  Substance Use Topics  . Smoking status: Never Smoker   . Smokeless tobacco: Never Used  . Alcohol Use: No      Review of Systems  All other systems reviewed and are negative.    Allergies  Review of patient's allergies indicates no known allergies.  Home Medications   Current Outpatient Rx  Name Route Sig Dispense Refill  . TRAMADOL HCL 50 MG PO TABS Oral Take 50 mg by mouth every 6 (six)  hours as needed. For pain      BP 134/76  Pulse 99  Temp(Src) 98 F (36.7 C) (Oral)  Resp 16  SpO2 100%  Physical Exam  Nursing note and vitals reviewed. Constitutional: He appears well-developed and well-nourished. No distress.  HENT:  Head: Normocephalic and atraumatic.  Right Ear: External ear normal.  Left Ear: External ear normal.  Eyes: Conjunctivae are normal. Right eye exhibits no discharge. Left eye exhibits no discharge. No scleral icterus.  Neck: Neck supple. No tracheal deviation present.  Cardiovascular: Normal rate, regular rhythm and intact distal pulses.   Pulmonary/Chest: Effort normal and breath sounds normal. No stridor. No respiratory distress. He has no wheezes. He has no rales.  Abdominal: Soft. Bowel sounds are normal. He exhibits no distension. There is tenderness in the right upper quadrant. There is guarding. There is no rebound.  Musculoskeletal: He exhibits no edema and no tenderness.  Neurological: He is alert. He has normal strength. No sensory deficit. Cranial nerve deficit:  no gross defecits noted. He exhibits normal muscle tone. He displays no seizure activity. Coordination normal.  Skin: Skin is warm and dry. No rash noted.  Psychiatric: He has a normal mood and affect.    ED Course  Procedures (including critical care time)  Labs Reviewed  COMPREHENSIVE METABOLIC PANEL - Abnormal; Notable  for the following:    Glucose, Bld 61 (*)    All other components within normal limits  URINALYSIS, ROUTINE W REFLEX MICROSCOPIC - Abnormal; Notable for the following:    Glucose, UA 100 (*)    Ketones, ur 15 (*)    All other components within normal limits  CBC  DIFFERENTIAL  LIPASE, BLOOD  GLUCOSE, CAPILLARY   No results found.   No diagnosis found.    MDM  Dr. Dwain Sarna has been down to the emergency room to evaluate the patient. We will check on the labs and CT scan. Depending on those results we will determine whether he needs to be  admitted for this postoperative pain.  Dr Lindie Spruce has reviewed results as well.  Pt is pending his CT scan.   We will consult with them once the results are back.        Celene Kras, MD 07/25/11 [phone]

## 2011-07-26 ENCOUNTER — Ambulatory Visit (INDEPENDENT_AMBULATORY_CARE_PROVIDER_SITE_OTHER): Payer: BC Managed Care – PPO | Admitting: General Surgery

## 2011-07-26 ENCOUNTER — Other Ambulatory Visit (INDEPENDENT_AMBULATORY_CARE_PROVIDER_SITE_OTHER): Payer: Self-pay | Admitting: Surgery

## 2011-07-26 ENCOUNTER — Encounter (INDEPENDENT_AMBULATORY_CARE_PROVIDER_SITE_OTHER): Payer: Self-pay

## 2011-07-26 ENCOUNTER — Encounter (INDEPENDENT_AMBULATORY_CARE_PROVIDER_SITE_OTHER): Payer: Self-pay | Admitting: General Surgery

## 2011-07-26 VITALS — BP 106/70 | HR 76 | Temp 98.4°F | Resp 16 | Ht 76.0 in | Wt 262.4 lb

## 2011-07-26 DIAGNOSIS — Z9884 Bariatric surgery status: Secondary | ICD-10-CM

## 2011-07-26 NOTE — Progress Notes (Signed)
Chief complaint: Followup gastric bypass and cholecystectomy, abdominal pain  History: Patient returned to the office with a history of gastric bypass one year ago for morbid obesity and insulin-dependent diabetes. His surgery was complicated by postoperative anastomotic bleed but he did not require reoperation. On early followup at 6 months he was doing very well with resolution of his diabetes and no particular complaints or complications identified. He subsequently developed abdominal pain and was worked up at [facility] in October and found to have gallstones. Reportedly upper endoscopy was normal at that time as well as colonoscopy. He subsequently underwent laparoscopic cholecystectomy by Dr. Wakefield in October. This was also complicated by postoperative bleeding requiring transfusion but not reoperation. He has had a lot of difficulty with abdominal pain since that time. Most of his evaluation has been done out of town and I do not have many of the records. He however has had continued right upper quadrant abdominal pain since his cholecystectomy. This is not necessarily related to eating. He has felt febrile. He also is unable to tolerate much in the way of solid foods and is eating mainly protein shakes. He has been unable to tolerate any vitamin or mineral supplements due to vomiting and has been followed by Dr. Bowen and receiving B12 shots and has had iron infusions. He also complains of severe itching rash on his trunk and back that comes and goes. He showed be photos of this which look like hives. We called this week complaining of continued severe pain and fever. We estimated emergency room as she did yesterday. He was evaluated there and I reviewed the results. A CT scan of the abdomen showed only a very tiny fluid collection at the gallbladder fossa, essentially normal. White blood count and hemoglobin were normal. LFTs are normal. He was hospitalized at [facility] approximately a month ago  with the same complaints and apparently a HIDA scan was negative for bile leak. He also generally feels weak. His weight has actually been pretty stable over the last couple of months. His diabetes remains in remission.  Exam: BP 106/70  Pulse 76  Temp(Src) 98.4 F (36.9 C) (Temporal)  Resp 16  Ht 6' 4" (1.93 m)  Wt 262 lb 6.4 oz (119.024 kg)  BMI 31.94 kg/m2 General: He does not appear ill or in any distress. Skin: Today there is no evidence of rash or infection Lungs: Clear without wheezing or increased work of breathing Abdomen: Nondistended. Well-healed wounds. There is moderate right upper quadrant tenderness. Extremities: No edema or deformity Neurologic: He is alert, appropriate, and fully oriented. Gait normal.  Assessment and plan: One year status post laparoscopic Roux-en-Y gastric bypass. He has had excellent appropriate weight loss of 100 pounds and resolution of his diabetes. He is status post cholecystectomy. He has problems with continued right upper quadrant abdominal pain, inability to tolerate much solid food or his vitamin supplements, and recurring what appeared to be hives.  It somewhat difficult to pull this together. Recent workup does not indicate any specific complication from his cholecystectomy at this time. I'm concerned and his inability to tolerate much solid food or vitamins due to vomiting. I think at this point he should have a repeat upper endoscopy to rule out stricture or ulcer. I've also going to ask him to see a dermatologist regarding his recurrent rash. I do not believe he is acutely ill at this time. He is being followed closely by Dr. Bowen in regard to his vitamin levels. I'm   going to see him back in 4 weeks and call with results of endoscopy 

## 2011-08-01 ENCOUNTER — Encounter (HOSPITAL_COMMUNITY): Payer: Self-pay

## 2011-08-07 ENCOUNTER — Encounter (HOSPITAL_COMMUNITY): Payer: Self-pay | Admitting: *Deleted

## 2011-08-07 ENCOUNTER — Ambulatory Visit (HOSPITAL_COMMUNITY)
Admission: RE | Admit: 2011-08-07 | Discharge: 2011-08-07 | Disposition: A | Discharge status: Home / Self Care | Payer: BC Managed Care – PPO | Source: Ambulatory Visit | Attending: Surgery | Admitting: Surgery

## 2011-08-07 ENCOUNTER — Encounter (HOSPITAL_COMMUNITY)
Admission: RE | Disposition: A | Discharge status: Home / Self Care | Payer: Self-pay | Source: Ambulatory Visit | Attending: Surgery

## 2011-08-07 DIAGNOSIS — R1013 Epigastric pain: Secondary | ICD-10-CM

## 2011-08-07 DIAGNOSIS — Z9884 Bariatric surgery status: Secondary | ICD-10-CM | POA: Insufficient documentation

## 2011-08-07 DIAGNOSIS — Z9089 Acquired absence of other organs: Secondary | ICD-10-CM | POA: Insufficient documentation

## 2011-08-07 DIAGNOSIS — R1310 Dysphagia, unspecified: Secondary | ICD-10-CM

## 2011-08-07 HISTORY — DX: Gastro-esophageal reflux disease without esophagitis: K21.9

## 2011-08-07 HISTORY — DX: Disorder of kidney and ureter, unspecified: N28.9

## 2011-08-07 HISTORY — DX: Vitamin deficiency, unspecified: E56.9

## 2011-08-07 HISTORY — DX: Shortness of breath: R06.02

## 2011-08-07 HISTORY — PX: ESOPHAGOGASTRODUODENOSCOPY: SHX5428

## 2011-08-07 HISTORY — DX: Unspecified jaundice: R17

## 2011-08-07 SURGERY — EGD (ESOPHAGOGASTRODUODENOSCOPY)
Anesthesia: Moderate Sedation

## 2011-08-07 MED ORDER — DIPHENHYDRAMINE HCL 50 MG/ML IJ SOLN
INTRAMUSCULAR | Status: AC
  Filled 2011-08-07: qty 1

## 2011-08-07 MED ORDER — PROMETHAZINE HCL 25 MG/ML IJ SOLN
INTRAMUSCULAR | Status: AC
  Filled 2011-08-07: qty 1

## 2011-08-07 MED ORDER — BUTAMBEN-TETRACAINE-BENZOCAINE 2-2-14 % EX AERO
INHALATION_SPRAY | CUTANEOUS | Status: DC | PRN
  Administered 2011-08-07: 2 via TOPICAL

## 2011-08-07 MED ORDER — FENTANYL CITRATE 0.05 MG/ML IJ SOLN
INTRAMUSCULAR | Status: AC
  Filled 2011-08-07: qty 2

## 2011-08-07 MED ORDER — MIDAZOLAM HCL 10 MG/2ML IJ SOLN
INTRAMUSCULAR | Status: AC
  Filled 2011-08-07: qty 2

## 2011-08-07 MED ORDER — FENTANYL CITRATE 0.05 MG/ML IJ SOLN
INTRAMUSCULAR | Status: DC | PRN
  Administered 2011-08-07 (×2): 25 ug via INTRAVENOUS

## 2011-08-07 MED ORDER — MIDAZOLAM HCL 10 MG/2ML IJ SOLN
INTRAMUSCULAR | Status: DC | PRN
  Administered 2011-08-07: 2 mg via INTRAVENOUS
  Administered 2011-08-07: 1 mg via INTRAVENOUS
  Administered 2011-08-07: 2 mg via INTRAVENOUS

## 2011-08-07 MED ORDER — SODIUM CHLORIDE 0.9 % IV SOLN
Freq: Once | INTRAVENOUS | Status: AC
  Administered 2011-08-07: 500 mL via INTRAVENOUS

## 2011-08-07 NOTE — H&P (View-Only) (Signed)
Chief complaint: Followup gastric bypass and cholecystectomy, abdominal pain  History: Patient returned to the office with a history of gastric bypass one year ago for morbid obesity and insulin-dependent diabetes. His surgery was complicated by postoperative anastomotic bleed but he did not require reoperation. On early followup at 6 months he was doing very well with resolution of his diabetes and no particular complaints or complications identified. He subsequently developed abdominal pain and was worked up at [facility] in October and found to have gallstones. Reportedly upper endoscopy was normal at that time as well as colonoscopy. He subsequently underwent laparoscopic cholecystectomy by Dr. Dwain Sarna in October. This was also complicated by postoperative bleeding requiring transfusion but not reoperation. He has had a lot of difficulty with abdominal pain since that time. Most of his evaluation has been done out of town and I do not have many of the records. He however has had continued right upper quadrant abdominal pain since his cholecystectomy. This is not necessarily related to eating. He has felt febrile. He also is unable to tolerate much in the way of solid foods and is eating mainly protein shakes. He has been unable to tolerate any vitamin or mineral supplements due to vomiting and has been followed by Dr. Cathey Endow and receiving B12 shots and has had iron infusions. He also complains of severe itching rash on his trunk and back that comes and goes. He showed be photos of this which look like hives. We called this week complaining of continued severe pain and fever. We estimated emergency room as she did yesterday. He was evaluated there and I reviewed the results. A CT scan of the abdomen showed only a very tiny fluid collection at the gallbladder fossa, essentially normal. White blood count and hemoglobin were normal. LFTs are normal. He was hospitalized at [facility] approximately a month ago  with the same complaints and apparently a HIDA scan was negative for bile leak. He also generally feels weak. His weight has actually been pretty stable over the last couple of months. His diabetes remains in remission.  Exam: BP 106/70  Pulse 76  Temp(Src) 98.4 F (36.9 C) (Temporal)  Resp 16  Ht 6\' 4"  (1.93 m)  Wt 262 lb 6.4 oz (119.024 kg)  BMI 31.94 kg/m2 General: He does not appear ill or in any distress. Skin: Today there is no evidence of rash or infection Lungs: Clear without wheezing or increased work of breathing Abdomen: Nondistended. Well-healed wounds. There is moderate right upper quadrant tenderness. Extremities: No edema or deformity Neurologic: He is alert, appropriate, and fully oriented. Gait normal.  Assessment and plan: One year status post laparoscopic Roux-en-Y gastric bypass. He has had excellent appropriate weight loss of 100 pounds and resolution of his diabetes. He is status post cholecystectomy. He has problems with continued right upper quadrant abdominal pain, inability to tolerate much solid food or his vitamin supplements, and recurring what appeared to be hives.  It somewhat difficult to pull this together. Recent workup does not indicate any specific complication from his cholecystectomy at this time. I'm concerned and his inability to tolerate much solid food or vitamins due to vomiting. I think at this point he should have a repeat upper endoscopy to rule out stricture or ulcer. I've also going to ask him to see a dermatologist regarding his recurrent rash. I do not believe he is acutely ill at this time. He is being followed closely by Dr. Cathey Endow in regard to his vitamin levels. I'm  going to see him back in 4 weeks and call with results of endoscopy

## 2011-08-07 NOTE — Op Note (Signed)
Casey Fletcher, Casey          ACCOUNT NO.:  [id number]  MEDICAL RECORD NO.:  [id number]  LOCATION:  [facility]                         FACILITY:  [facility]  PHYSICIAN:  Sandria Bales. Ezzard Standing, M.D.  DATE OF BIRTH:  [birth year]  DATE OF PROCEDURE:  08/07/2011                              OPERATIVE REPORT   PREOPERATIVE DIAGNOSIS:  Dysphagia with epigastric abdominal pain.  POSTOPERATIVE DIAGNOSIS:  Normal Roux-en-Y gastric pouch. Esophagogastric junction at 39 cm and gastrojejunal anastomosis at 44 cm.  PROCEDURE:  Esophagogastrojejunoscopy.  Biopsy of gastric wall for CLO.  SURGEON:  Sandria Bales. Ezzard Standing, M.D.  ANESTHESIA:  50 mcg of fentanyl, 5 mg of Versed.  COMPLICATIONS:  None.  INDICATION FOR PROCEDURE:  [name] is a 36 year old Hispanic male who underwent a laparoscopic Roux-en-Y gastric bypass on July 24, 2010 by Dr. Jaclynn Guarneri.  He has had successful weight loss of over 100 pounds, however the several months, has had increasing epigastric pain and problems with dysphagia and nausea.  He underwent a laparoscopic cholecystectomy by Dr. Dwain Sarna in October 2012, but this really did not change his symptoms.  He now comes for me to do an upper endoscopy to evaluate if there is some problem with the pouch.  The indications and potential complications of the procedure were explained to the patient.  OPERATIVE NOTE:  The patient was placed in a supine position.  The back of his throat was anesthetized with Cetacaine.  He was then rolled on his side.    A time-out was held and the surgical checklist reviewed.  He had a bite block placed in his mouth.  He was given a total of 50 mcg of fentanyl, 5 mg of Versed.  A flexible Pentax endoscope was passed down the back of his throat.  He had a little trouble swallowing this. I think he probably has a little bit of spasm of his cricopharyngeus, particularly knowing his symptoms.  Anyway, I was able to pass the endoscope down  into his gastric pouch.    I identified his gastrojejunal anastomosis.  He went down his efferent limb about 20-25 cm.  He has a fairly long afferent limb, which is about 15 cm in length.  His gastrojejunal anastomosis was at 44 cm.  There was a scar in one area of the GJ anastomosis, which could have been a healed ulcer but more likely is just a scarring from his original surgery.  He has the staples at the GJ, but  I saw no evidence of active ulceration.    Mucosa of his gastric pouch was somewhat atrophic.  I biopsied this for CLO test.  His esophagogastric junction was at 39 cm.  We had a 5 cm pouch, which was otherwise normal and his esophagus was unremarkable.  I really cannot find a reason endoscopically for his pain.  Photos were taken and were placed in his chart and copies given to the patient.  He will follow up with Dr. Johna Sheriff in several weeks.  I will talk to him about placing him on Protonix possibly as just a therapeutic trial to see if this helps his symptoms at all.   Sandria Bales. Ezzard Standing, M.D., [facility]   DHN/MEDQ  D:  08/07/2011  T:  08/07/2011  Job:  [id number]  cc:   Casey Fletcher, D.O. Fax: [phone]  Casey Fletcher. Hoxworth, M.D. [address], [address] [place] [address]

## 2011-08-07 NOTE — Interval H&P Note (Signed)
History and Physical Interval Note:  08/07/2011 7:29 AM  Casey Fletcher  has presented today for surgery, with the diagnosis of post surgery/abd.pain  The various methods of treatment have been discussed with the patient and family. He had a RYGB by Dr. Leonard Schwartz. Hoxworth on 07/24/2010.  He had a cholecystectomy by Dr. Dwain Sarna in Oct 2012.  But he has been plagued with upper abdominal pain and some dysphagia symptoms.  After consideration of risks, benefits and other options for treatment, the patient has consented to  Procedure(s): ESOPHAGOGASTRODUODENOSCOPY (EGD) as a surgical intervention .    The patients' history has been reviewed, patient examined, no change in status, stable for surgery.  I have reviewed the patients' chart and labs.  Questions were answered to the patient's satisfaction.    Keviana Guida H

## 2011-08-07 NOTE — Brief Op Note (Addendum)
08/07/2011  8:13 AM  PATIENT:  Casey Fletcher, 36 y.o., male, MRN: [id number]  PREOP DIAGNOSIS:  post surgery/abd.pain  POSTOP DIAGNOSIS:   Normal RYGB pouch, GJ at 44 cm, EJ at 39 cm  PROCEDURE:   Procedure(s):  ESOPHAGOGASTROjejunoscopy, biopsy for CLO test  SURGEON:   Ovidio Kin, M.D.  ASSISTANT:   none  ANESTHESIA:   IV sedation  * No anesthesia staff entered *  Moderate Sedation  EBL:  None ml  BLOOD ADMINISTERED: none  DRAINS: none   LOCAL MEDICATIONS USED:   none  SPECIMEN:   CLO test  COUNTS CORRECT:  YES  INDICATIONS FOR PROCEDURE:  Casey Fletcher is a 35 y.o. (DOB: 21-Nov-1975) hispanic male whose primary care physician is Seymour Bars, DO, DO and comes for upper endoscopy   The indications and risks of the surgery were explained to the patient.  The risks include, but are not limited to, infection, bleeding, and nerve injury.  Note dictated to:   #[id number]  DN  08/07/2011

## 2011-08-07 NOTE — OR Nursing (Signed)
Called Dr. Ezzard Standing to report of abd. Pain after drinking water.  Pt. Rated pain as 9.  VS remain stable.  Ambulated to br.  Instructed to fill Protonix med and try to see if that helps.  No other instruction from Dr. Ezzard Standing.  Pt. Has pain med at home and instructed to take as prescribed.

## 2011-08-08 ENCOUNTER — Encounter (HOSPITAL_COMMUNITY): Payer: Self-pay

## 2011-08-08 ENCOUNTER — Encounter (HOSPITAL_COMMUNITY): Payer: Self-pay | Admitting: Surgery

## 2011-08-08 LAB — CLOTEST (H. PYLORI), BIOPSY: Helicobacter screen: NEGATIVE

## 2011-08-09 ENCOUNTER — Encounter (INDEPENDENT_AMBULATORY_CARE_PROVIDER_SITE_OTHER): Payer: Self-pay

## 2011-08-14 ENCOUNTER — Telehealth (INDEPENDENT_AMBULATORY_CARE_PROVIDER_SITE_OTHER): Payer: Self-pay | Admitting: General Surgery

## 2011-08-14 NOTE — Telephone Encounter (Signed)
Ana the patients wife called and stated the patient is still experiencing n/v and periodic diarrhea. She also states he has tremendous pain and is unable to keep fluids and solid food down. Paged Dr Johna Sheriff for advise

## 2011-08-30 ENCOUNTER — Encounter (INDEPENDENT_AMBULATORY_CARE_PROVIDER_SITE_OTHER): Payer: Self-pay

## 2011-08-30 ENCOUNTER — Encounter (INDEPENDENT_AMBULATORY_CARE_PROVIDER_SITE_OTHER): Payer: Self-pay | Admitting: General Surgery

## 2011-08-30 ENCOUNTER — Ambulatory Visit (INDEPENDENT_AMBULATORY_CARE_PROVIDER_SITE_OTHER): Payer: BC Managed Care – PPO | Admitting: General Surgery

## 2011-08-30 DIAGNOSIS — R109 Unspecified abdominal pain: Secondary | ICD-10-CM | POA: Insufficient documentation

## 2011-08-30 DIAGNOSIS — E669 Obesity, unspecified: Secondary | ICD-10-CM

## 2011-08-30 NOTE — Progress Notes (Signed)
Addended by: Maryan Puls on: 08/30/2011 05:34 PM   Modules accepted: Orders

## 2011-08-30 NOTE — Progress Notes (Signed)
History: Patient returns to the office now just over one year following laparoscopic Roux-en-Y gastric bypass. He underwent cholecystectomy in December for gallstones and acute cholecystitis. He has had a lot of problems with abdominal pain ever since. He continues to have significant abdominal pain. He describes aching pain in his left upper quadrant which is essentially constant. It seems to be worse with eating. He has occasional vomiting. His wife says he really doesn't want to do much and doesn't go out because of the pain. He occasionally gets weak and pale when it hurts. This has now been going on for over 2 months.  He has had an extensive workup. Some of this has been in [place] immediately following his cholecystectomy. This was complicated by the bleeding and hemoperitoneum. He had studies to rule out bile leak or other complications in [place] and these were all negative. He saw a GI physician [facility]. His workup here includes laparoscopic evaluation at the time of his cholecystectomy. I assisted Dr. Dwain Sarna and I examined his entire small intestine which was normal and there was no internal hernia identified. He has had an upper endoscopy last month that was entirely normal. He has had a CT scan that was unremarkable. He did not however have oral contrast at that time.  Exam:  Gen.: He is not appear acutely ill Skin: No rash or infection Lymph nodes no cervical subclavicular nodes palpable Lungs: Clear without wheezing or purpuric or breathing Abdomen: Mild to moderate left upper quadrant tenderness. No masses or hernias. No organomegaly. Extremities. No edema.  Recent complete lab work done by Dr. Cathey Endow was all unremarkable except for slightly low B12 and he is receiving injections.  Assessment and plan: Status post otoscopic Roux-en-Y gastric bypass just over one year postop and status postcholecystectomy. Weight loss is 100 pounds. His weight actually is up 1 pound since December. He  has chronic abdominal pain of several months duration with an extensive negative workup. On certainly not clear as to the source of his pain. Reviewing his studies the only stone we haven't turned over is that his recent CT did not have oral contrast limiting evaluation of the small intestine. Therefore going to order a repeat CT enteroclysis. I'll colon with these results. If this is negative then I think close followup and symptomatic management is the only option that I see. He will return no later than 3 months

## 2011-08-30 NOTE — Progress Notes (Signed)
Addended by: Maryan Puls on: 08/30/2011 11:03 AM   Modules accepted: Orders

## 2011-08-31 ENCOUNTER — Other Ambulatory Visit (INDEPENDENT_AMBULATORY_CARE_PROVIDER_SITE_OTHER): Payer: Self-pay | Admitting: General Surgery

## 2011-08-31 ENCOUNTER — Other Ambulatory Visit (INDEPENDENT_AMBULATORY_CARE_PROVIDER_SITE_OTHER): Payer: Self-pay

## 2011-09-04 ENCOUNTER — Inpatient Hospital Stay: Admission: RE | Admit: 2011-09-04 | Payer: Self-pay | Source: Ambulatory Visit

## 2011-09-06 ENCOUNTER — Inpatient Hospital Stay: Admission: RE | Admit: 2011-09-06 | Payer: Self-pay | Source: Ambulatory Visit

## 2011-09-13 ENCOUNTER — Ambulatory Visit
Admission: RE | Admit: 2011-09-13 | Discharge: 2011-09-13 | Disposition: A | Discharge status: Home / Self Care | Payer: BC Managed Care – PPO | Source: Ambulatory Visit | Attending: General Surgery | Admitting: General Surgery

## 2011-09-13 MED ORDER — IOHEXOL 300 MG/ML  SOLN
125.0000 mL | Freq: Once | INTRAMUSCULAR | Status: AC | PRN
  Administered 2011-09-13: 125 mL via INTRAVENOUS

## 2011-09-14 ENCOUNTER — Encounter (INDEPENDENT_AMBULATORY_CARE_PROVIDER_SITE_OTHER): Payer: Self-pay

## 2011-12-06 ENCOUNTER — Encounter (INDEPENDENT_AMBULATORY_CARE_PROVIDER_SITE_OTHER): Payer: Self-pay

## 2011-12-14 ENCOUNTER — Ambulatory Visit (INDEPENDENT_AMBULATORY_CARE_PROVIDER_SITE_OTHER): Payer: BC Managed Care – PPO | Admitting: General Surgery

## 2011-12-20 ENCOUNTER — Encounter (INDEPENDENT_AMBULATORY_CARE_PROVIDER_SITE_OTHER): Payer: Self-pay | Admitting: General Surgery

## 2013-07-24 IMAGING — CT CT ABD-PELV W/ CM
2 of 4 series · 17 of 46 positions shown, 19 images · IV contrast (water/omni  & 100ml omni 300)
Comparison: None.

CLINICAL DATA: History of bile leak.  Fever.  Abdominal and back
pain.

CT ABDOMEN AND PELVIS WITH CONTRAST
TECHNIQUE: Multidetector CT imaging of the abdomen and pelvis was
performed following the standard protocol during bolus
administration of intravenous contrast.
Contrast: 100mL OMNIPAQUE IOHEXOL 300 MG/ML IV SOLN

[Series 2: routine abdomen · axial · 0.77mm/px · z∈[-522,-7]mm · 14 of 95 slices shown, 16 images]
[im 4/95  soft-tissue]
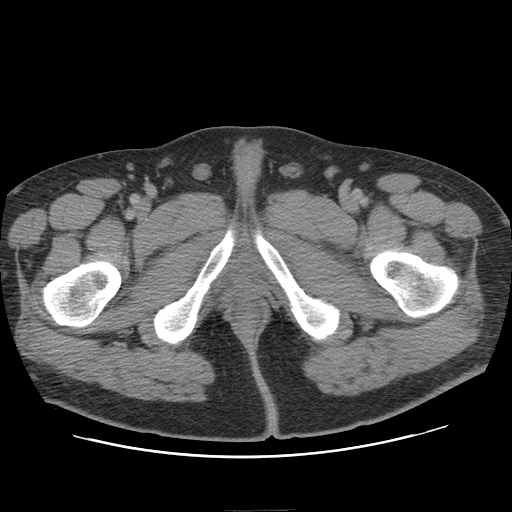
[im 4/95  bone]
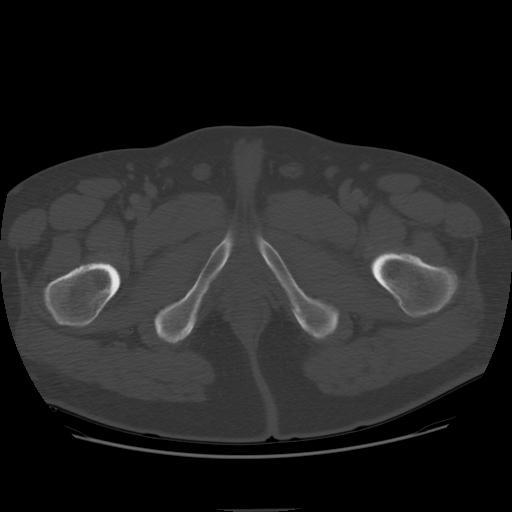
[im 12/95  soft-tissue]
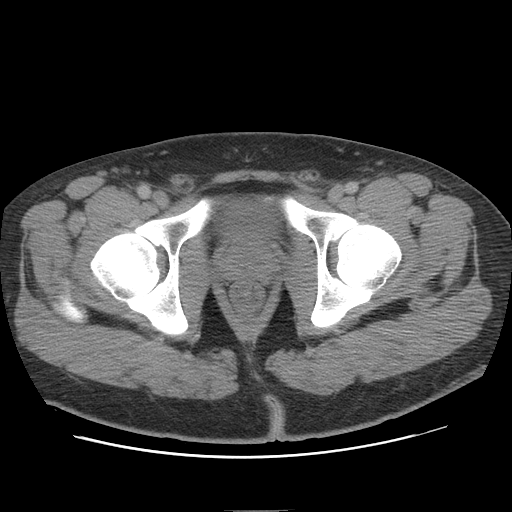
[im 20/95  soft-tissue]
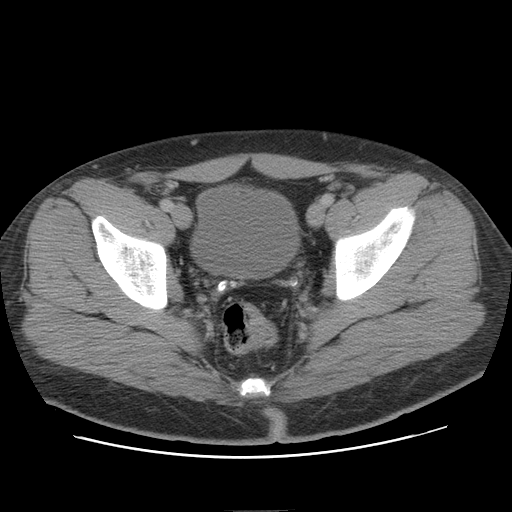
[im 24/95  soft-tissue]
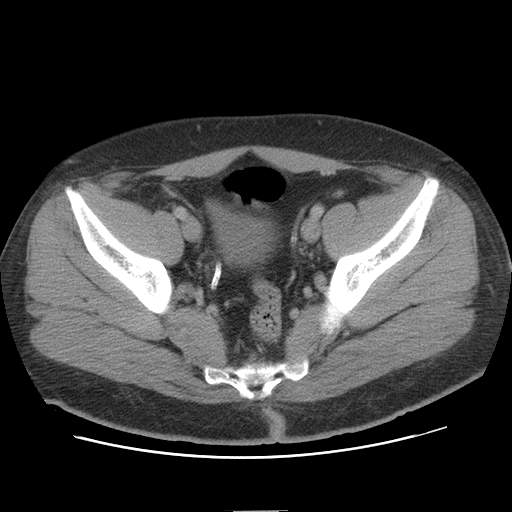
[im 32/95  soft-tissue]
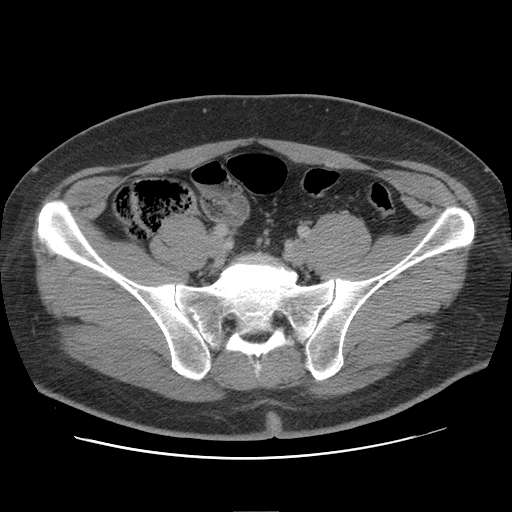
[im 40/95  soft-tissue]
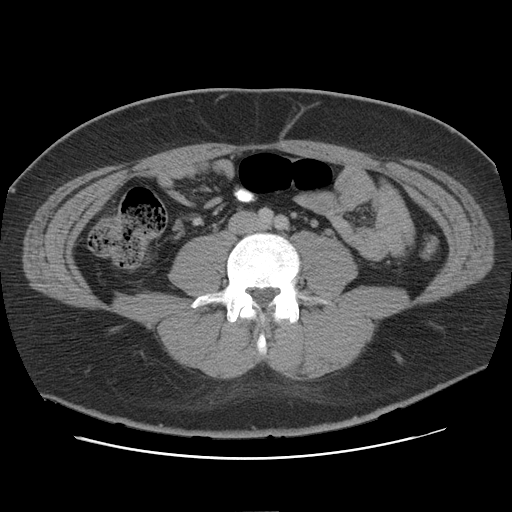
[im 44/95  soft-tissue]
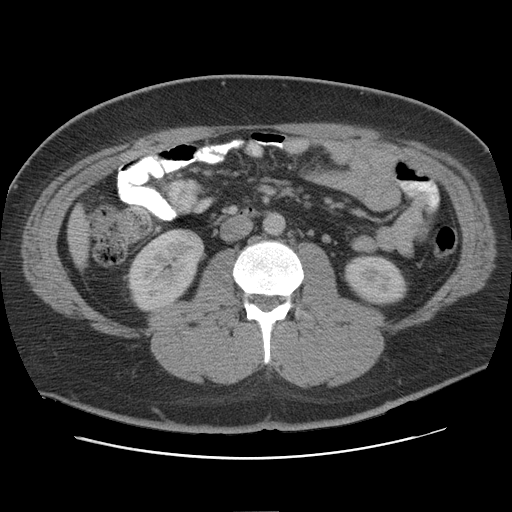
[im 51/95  soft-tissue]
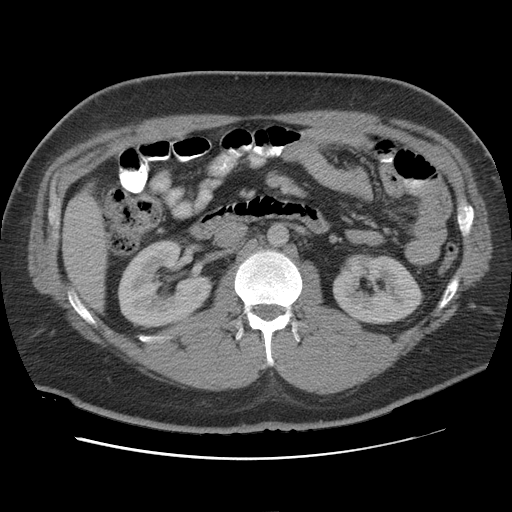
[im 55/95  soft-tissue]
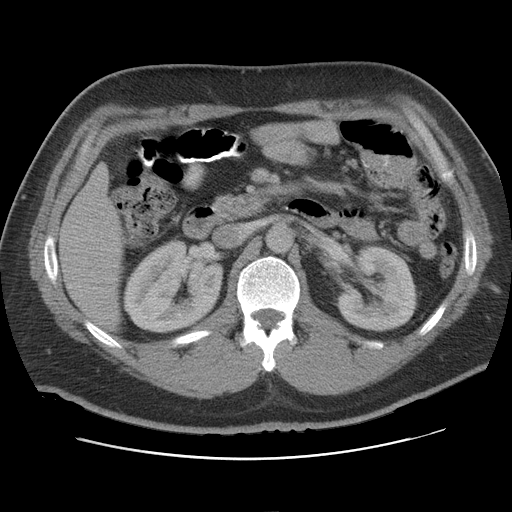
[im 55/95  bone]
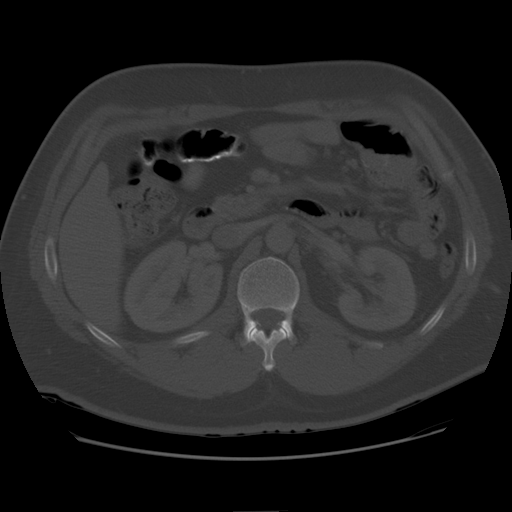
[im 63/95  soft-tissue]
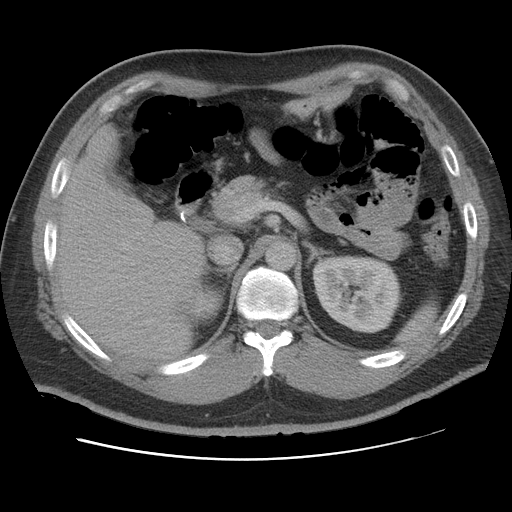
[im 71/95  soft-tissue]
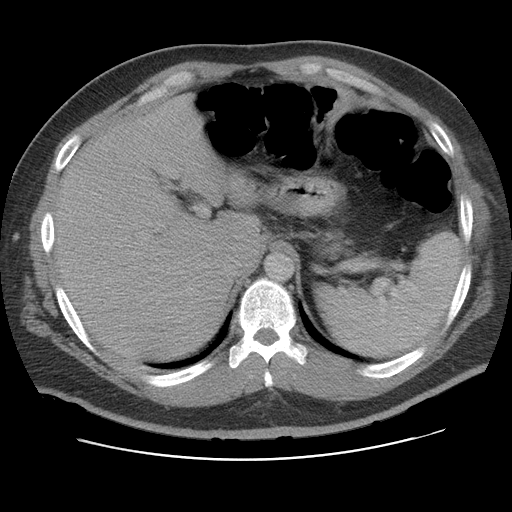
[im 75/95  soft-tissue]
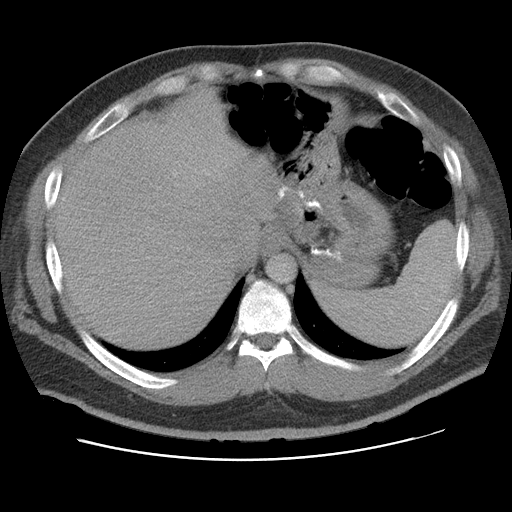
[im 83/95  soft-tissue]
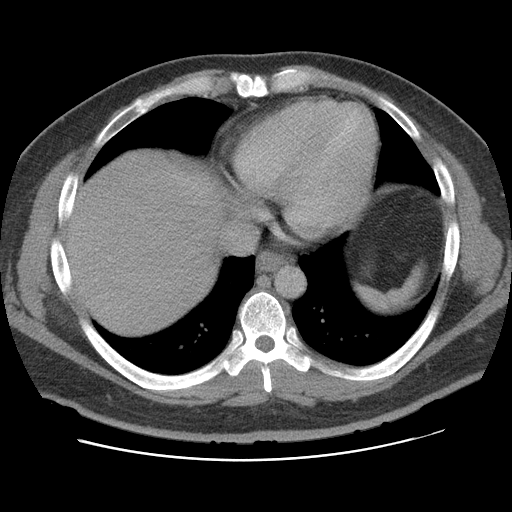
[im 91/95  soft-tissue]
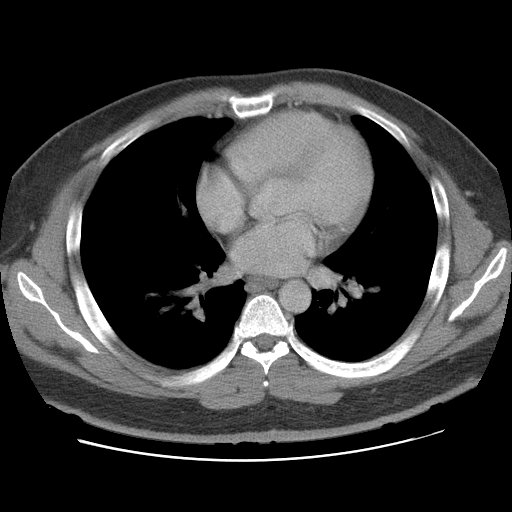

[Series 400: sag · sagittal · 1.04mm/px · 3 of 134 slices shown]
[im 45/134  soft-tissue]
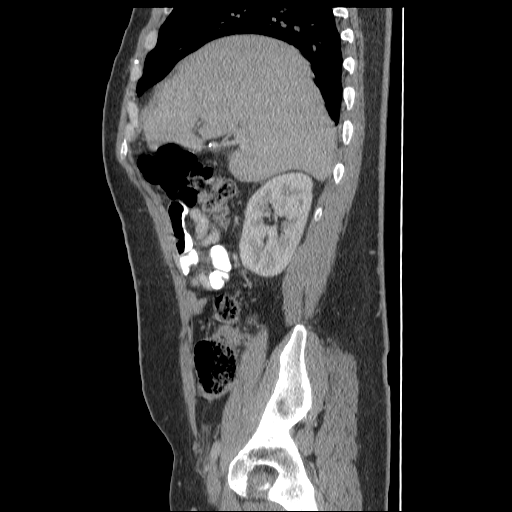
[im 60/134  soft-tissue]
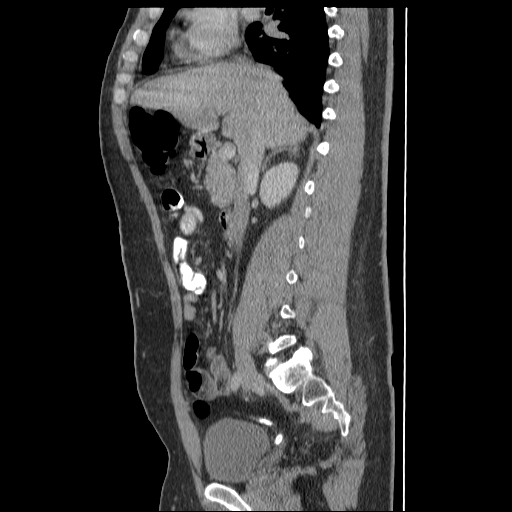
[im 74/134  soft-tissue]
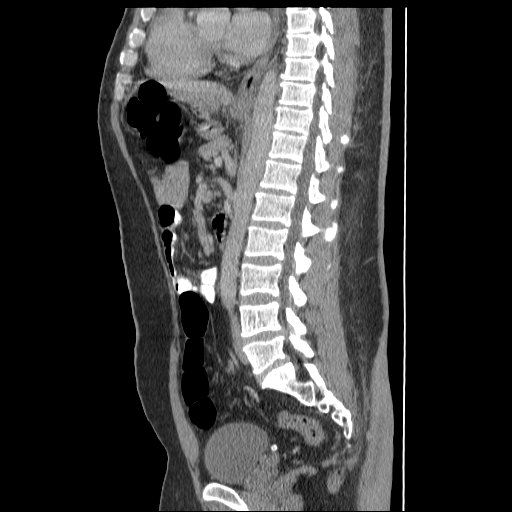

[17 of 46 positions shown; findings below may reference images not displayed]

FINDINGS: Dependent atelectasis at the lung bases.  Visualized
heart grossly normal.  Cholecystectomy.  Tiny area of fluid is
present in the gallbladder fossa (image 32), presumably
postoperative.  There is no mass effect.  No intrahepatic lesions
are identified.  The spleen appears normal.  The gastric stapling
is present.  Evaluation of the hollow viscera is suboptimal in the
absence of oral contrast.  There is no dilated small bowel.
Prominent stool is present in the colon.

Appendix not identified, but there are no right lower quadrant
inflammatory changes.  Diabetic type calcification of the vas
deferens.

Tiny calcification is present at a urachal remnant.  No aggressive
osseous lesions are present.  Benign vertebral body hemangioma
present at L4. There is normal renal enhancement.  Pancreas and
common bile duct appear normal.  No calcified gallstones.
IMPRESSION: 1.  No acute abnormality.
2.  Postoperative changes of gastric bypass.
3.  Cholecystectomy.  Tiny fluid collection present in the
gallbladder fossa.  This may be subcapsular or intraperitoneal and
in the setting of prior bile leak may represent seroma or tiny
biloma.

## 2014-04-17 DIAGNOSIS — G510 Bell's palsy: Secondary | ICD-10-CM | POA: Insufficient documentation

## 2014-04-19 DIAGNOSIS — R531 Weakness: Secondary | ICD-10-CM | POA: Insufficient documentation

## 2015-08-02 ENCOUNTER — Ambulatory Visit: Payer: Self-pay | Admitting: Family Medicine

## 2015-08-08 ENCOUNTER — Ambulatory Visit: Payer: Self-pay | Admitting: Family Medicine

## 2015-08-16 ENCOUNTER — Encounter: Payer: Self-pay | Admitting: Gastroenterology

## 2015-08-16 ENCOUNTER — Encounter: Payer: Self-pay | Admitting: Family Medicine

## 2015-08-16 ENCOUNTER — Ambulatory Visit (INDEPENDENT_AMBULATORY_CARE_PROVIDER_SITE_OTHER): Payer: BC Managed Care – PPO | Admitting: Family Medicine

## 2015-08-16 ENCOUNTER — Other Ambulatory Visit: Payer: Self-pay | Admitting: Family Medicine

## 2015-08-16 VITALS — BP 131/92 | HR 76 | Wt 300.0 lb

## 2015-08-16 DIAGNOSIS — I10 Essential (primary) hypertension: Secondary | ICD-10-CM

## 2015-08-16 DIAGNOSIS — R1012 Left upper quadrant pain: Secondary | ICD-10-CM | POA: Diagnosis not present

## 2015-08-16 DIAGNOSIS — R110 Nausea: Secondary | ICD-10-CM

## 2015-08-16 DIAGNOSIS — K920 Hematemesis: Secondary | ICD-10-CM

## 2015-08-16 DIAGNOSIS — K921 Melena: Secondary | ICD-10-CM

## 2015-08-16 DIAGNOSIS — Z808 Family history of malignant neoplasm of other organs or systems: Secondary | ICD-10-CM

## 2015-08-16 DIAGNOSIS — R946 Abnormal results of thyroid function studies: Secondary | ICD-10-CM

## 2015-08-16 LAB — COMPLETE METABOLIC PANEL WITH GFR
ALT: 15 U/L (ref 9–46)
AST: 19 U/L (ref 10–40)
Albumin: 4.1 g/dL (ref 3.6–5.1)
Alkaline Phosphatase: 69 U/L (ref 40–115)
BUN: 12 mg/dL (ref 7–25)
CALCIUM: 9.2 mg/dL (ref 8.6–10.3)
CHLORIDE: 107 mmol/L (ref 98–110)
CO2: 24 mmol/L (ref 20–31)
Creat: 0.78 mg/dL (ref 0.60–1.35)
Glucose, Bld: 120 mg/dL — ABNORMAL HIGH (ref 65–99)
POTASSIUM: 3.7 mmol/L (ref 3.5–5.3)
Sodium: 140 mmol/L (ref 135–146)
Total Bilirubin: 0.7 mg/dL (ref 0.2–1.2)
Total Protein: 7.1 g/dL (ref 6.1–8.1)

## 2015-08-16 LAB — LIPASE: LIPASE: 16 U/L (ref 7–60)

## 2015-08-16 LAB — TSH: TSH: 3.879 u[IU]/mL (ref 0.350–4.500)

## 2015-08-16 LAB — LIPID PANEL
CHOL/HDL RATIO: 4.1 ratio (ref <=5.0)
CHOLESTEROL: 194 mg/dL (ref 125–200)
HDL: 47 mg/dL (ref >=40)
LDL Cholesterol: 126 mg/dL (ref <130)
TRIGLYCERIDES: 104 mg/dL (ref <150)
VLDL: 21 mg/dL (ref <30)

## 2015-08-16 LAB — GAMMA GT: GGT: 14 U/L (ref 7–51)

## 2015-08-16 LAB — POCT GLYCOSYLATED HEMOGLOBIN (HGB A1C): HEMOGLOBIN A1C: 5.5

## 2015-08-16 LAB — AMYLASE: Amylase: 47 U/L (ref 0–105)

## 2015-08-16 MED ORDER — AMLODIPINE BESYLATE 5 MG PO TABS: 5.0000 mg | ORAL_TABLET | Freq: Every day | ORAL | Status: DC

## 2015-08-16 MED ORDER — BUPROPION HCL ER (XL) 150 MG PO TB24
150.0000 mg | ORAL_TABLET | ORAL | Status: DC
Start: 2015-08-16 — End: 2015-09-07

## 2015-08-16 NOTE — Progress Notes (Signed)
Subjective:    Patient ID: Casey Fletcher, male    DOB: 11/01/1975, 40 y.o.   MRN: [id number]  HPI Here to re-estab care today. He was last seen about 4-1/2 years ago in our office. Had gastric bypass and quit coming. Unfortunately he has gained some weight back.  Prior hx of Diabetes.  + family hx of thyroid cancer.  Has gained about 30 lbs back.  Vomited blood last week adn had some blood in the stool. This has been going on for a year on and off.  Doesn't drink soda or eat red meat.  Says eats sweets when he is stressed and he has been doing that more recently. Though over the last couple of weeks he's had a significant decline in appetite and is actually lost a few pounds. No recent fevers or chills.     Hypertension- he has a prior history of hypertension but was able to discontinue medications after his weight loss surgery. He said when he had a physical at work recently his blood pressure was elevated. Then we had checked a couple weeks later it was still high.  Had bells palsy since had the flu shot.  He does not want to have another one.  Also worried about his thyroid. Had some calcifications seen  On [place] years ago.  Has never had a f/u.  He has a strong family history of thyroid carcinoma and would like to address these issues.  He also reports that he's been feeling down and sad more so over the last year. He said he went through a severe depression right after his bypass surgery but then rebounded and came off the medication and did well and actually became employee of the year. These been struggling over this last year. He complains of feeling down and depressed nearly every day as well as having low energy and feeling like he has been overeating and eating the wrong foods. He reports difficulty with concentration and feeling bad about himself. He denies any thoughts of wanting to harm himself. He also reports a very high levels of anxiety and stress and feeling irritable and  overwhelmed.   Review of Systems  Constitutional: Positive for unexpected weight change. Negative for fever and diaphoresis.  HENT: Negative for hearing loss, rhinorrhea, sneezing and tinnitus.   Eyes: Negative for visual disturbance.  Respiratory: Negative for cough and wheezing.   Cardiovascular: Positive for chest pain. Negative for palpitations.  Gastrointestinal: Positive for blood in stool. Negative for nausea, vomiting and diarrhea.  Genitourinary: Negative for dysuria and discharge.  Musculoskeletal: Positive for myalgias and arthralgias.  Skin: Negative for rash.  Neurological: Negative for headaches.  Hematological: Negative for adenopathy.  Psychiatric/Behavioral: Positive for sleep disturbance and dysphoric mood. The patient is nervous/anxious.    BP 131/92 mmHg  Pulse 76  Wt 300 lb (136.079 kg)  SpO2 98%    Allergies  Allergen Reactions  . Influenza Vac Split [Flu Virus Vaccine] Other (See Comments)    Bell's Palsy    Past Medical History  Diagnosis Date  . Obesity   . Hypothyroidism   . Hypertension   . Diabetes mellitus   . Blood transfusion   . Anemia   . Asthma   . Chills   . Cough   . Blood in urine   . Fainting   . Weakness generalized   . Fatigue   . Abdominal pain   . Vitamin deficiency   . Jaundice   . Depression   .  Shortness of breath   . Renal insufficiency   . GERD (gastroesophageal reflux disease)     Past Surgical History  Procedure Laterality Date  . Cardiac catheterization  2005  . Appendectomy  1988  . Roux-en-y gastric bypass  1-12    Dr Johna Sheriff  . Cholecystectomy  05/08/11  . Esophagogastroduodenoscopy  08/07/2011    Procedure: ESOPHAGOGASTRODUODENOSCOPY (EGD);  Surgeon: Kandis Cocking, MD;  Location: [facility];  Service: Endoscopy;  Laterality: N/A;    Social History   Social History  . Marital Status: Married    Spouse Name: N/A  . Number of Children: N/A  . Years of Education: N/A   Occupational History  .  Not on file.   Social History Main Topics  . Smoking status: Never Smoker   . Smokeless tobacco: Never Used  . Alcohol Use: No  . Drug Use: No  . Sexual Activity: Not on file   Other Topics Concern  . Not on file   Social History Narrative    Family History  Problem Relation Age of Onset  . Hyperlipidemia Mother   . Hypertension Mother   . Hyperlipidemia Father   . Hypertension Father     Outpatient Encounter Prescriptions as of 08/16/2015  Medication Sig  . amLODipine (NORVASC) 5 MG tablet Take 1 tablet (5 mg total) by mouth daily.  Marland Kitchen buPROPion (WELLBUTRIN XL) 150 MG 24 hr tablet Take 1 tablet (150 mg total) by mouth every morning.  . [DISCONTINUED] amitriptyline (ELAVIL) 25 MG tablet daily.  . [DISCONTINUED] FREESTYLE TEST STRIPS test strip   . [DISCONTINUED] HYDROmorphone HCl (DILAUDID PO) Take by mouth as needed.  . [DISCONTINUED] OXYCODONE HCL PO Take by mouth as needed.  . [DISCONTINUED] polyethylene glycol powder (GLYCOLAX/MIRALAX) powder   . [DISCONTINUED] promethazine (PHENERGAN) 12.5 MG tablet Take 1 tablet (12.5 mg total) by mouth every 6 (six) hours as needed for nausea.  . [DISCONTINUED] promethazine (PHENERGAN) 25 MG tablet   . [DISCONTINUED] traMADol (ULTRAM) 50 MG tablet Take 50 mg by mouth every 6 (six) hours as needed. For pain   No facility-administered encounter medications on file as of 08/16/2015.           Objective:   Physical Exam  Constitutional: He is oriented to person, place, and time. He appears well-developed and well-nourished.  HENT:  Head: Normocephalic and atraumatic.  Neck: Neck supple. No thyromegaly present.  Cardiovascular: Normal rate, regular rhythm and normal heart sounds.   Pulmonary/Chest: Effort normal and breath sounds normal.  Abdominal: Soft. Bowel sounds are normal. He exhibits no distension and no mass. There is tenderness. There is no rebound and no guarding.  Mild tenderness in the LUQ.   Musculoskeletal: He  exhibits no edema.  Lymphadenopathy:    He has no cervical adenopathy.  Neurological: He is alert and oriented to person, place, and time.  Skin: Skin is warm and dry.  Psychiatric: He has a normal mood and affect. His behavior is normal.       Assessment & Plan:  HTN - Uncontrolled. Will start amlodipine. Follow-up in one month. Call if any side effects or concerns.  Blood in the vomit and stool - will refer for endoscopy and further evaluation to GI.  Sitter possibility of gastric ulcer. He does occasionally take a PPI but I encouraged him to take it every day between now and when he sees GI.  Depression acute, recurrent - sent onset of symptoms. PHQ- 9 score of 22. We discussed medication  options. He wants something very mild and that will not affect his ability to drive. Will start Wellbutrin. F/U  In 3 weeks.    Abnormal thyroid scan-he did have some microcalcifications in a biopsy done about 4 years ago. Will schedule repeat ultrasound and check thyroid levels.  Will call for last eye exam.  [employer] [facility].

## 2015-08-17 LAB — T4, FREE: Free T4: 1.2 ng/dL (ref 0.80–1.80)

## 2015-08-23 NOTE — Addendum Note (Signed)
Addended by: Deno Etienne on: 08/23/2015 09:47 AM   Modules accepted: Orders

## 2015-08-24 ENCOUNTER — Ambulatory Visit (INDEPENDENT_AMBULATORY_CARE_PROVIDER_SITE_OTHER): Payer: BC Managed Care – PPO

## 2015-08-24 ENCOUNTER — Other Ambulatory Visit: Payer: Self-pay | Admitting: Family Medicine

## 2015-08-24 ENCOUNTER — Ambulatory Visit: Payer: BC Managed Care – PPO

## 2015-08-24 ENCOUNTER — Other Ambulatory Visit: Payer: Self-pay

## 2015-08-24 DIAGNOSIS — Z808 Family history of malignant neoplasm of other organs or systems: Secondary | ICD-10-CM

## 2015-08-24 DIAGNOSIS — R946 Abnormal results of thyroid function studies: Secondary | ICD-10-CM | POA: Diagnosis not present

## 2015-08-24 LAB — CBC WITH DIFFERENTIAL/PLATELET
BASOS ABS: 0 10*3/uL (ref 0.0–0.1)
BASOS PCT: 0 % (ref 0–1)
EOS ABS: 0.1 10*3/uL (ref 0.0–0.7)
Eosinophils Relative: 1 % (ref 0–5)
HCT: 42.2 % (ref 39.0–52.0)
HEMOGLOBIN: 13.4 g/dL (ref 13.0–17.0)
Lymphocytes Relative: 23 % (ref 12–46)
Lymphs Abs: 1.6 10*3/uL (ref 0.7–4.0)
MCH: 28.7 pg (ref 26.0–34.0)
MCHC: 31.8 g/dL (ref 30.0–36.0)
MCV: 90.4 fL (ref 78.0–100.0)
MONOS PCT: 9 % (ref 3–12)
MPV: 12 fL (ref 8.6–12.4)
Monocytes Absolute: 0.6 10*3/uL (ref 0.1–1.0)
NEUTROS ABS: 4.6 10*3/uL (ref 1.7–7.7)
NEUTROS PCT: 67 % (ref 43–77)
PLATELETS: 294 10*3/uL (ref 150–400)
RBC: 4.67 MIL/uL (ref 4.22–5.81)
RDW: 13.1 % (ref 11.5–15.5)
WBC: 6.9 10*3/uL (ref 4.0–10.5)

## 2015-09-07 ENCOUNTER — Encounter: Payer: Self-pay | Admitting: Family Medicine

## 2015-09-07 ENCOUNTER — Ambulatory Visit (INDEPENDENT_AMBULATORY_CARE_PROVIDER_SITE_OTHER): Payer: BC Managed Care – PPO | Admitting: Family Medicine

## 2015-09-07 VITALS — BP 127/69 | HR 81 | Wt 306.0 lb

## 2015-09-07 DIAGNOSIS — I10 Essential (primary) hypertension: Secondary | ICD-10-CM

## 2015-09-07 DIAGNOSIS — R946 Abnormal results of thyroid function studies: Secondary | ICD-10-CM

## 2015-09-07 DIAGNOSIS — R1310 Dysphagia, unspecified: Secondary | ICD-10-CM

## 2015-09-07 DIAGNOSIS — Z23 Encounter for immunization: Secondary | ICD-10-CM

## 2015-09-07 MED ORDER — BUPROPION HCL ER (XL) 300 MG PO TB24: 300.0000 mg | ORAL_TABLET | ORAL | Status: DC

## 2015-09-07 MED ORDER — AMLODIPINE BESYLATE 5 MG PO TABS: 5.0000 mg | ORAL_TABLET | Freq: Every day | ORAL | Status: DC

## 2015-09-07 NOTE — Progress Notes (Signed)
Subjective:    Patient ID: Casey Fletcher, male    DOB: 1976/06/05, 40 y.o.   MRN: [id number]  HPI 3 week f/u for mood and for BP. He has felt better on the wellbutrin.   Hypertension- started amlodipine 3 weeks ago. Pt denies chest pain, SOB, dizziness, or heart palpitations.  Taking meds as directed w/o problems.  Denies medication side effects.    Depression, acute - started Wellbutrin 3 weeks ago.  Says it has increased his appetite.  Does feel ike it has helped his mood.    Abnormal thyroid US. They recommended bx.    He is schedule for bx on Monday.   Has noticed for months where food is getting hung in his throat.  Says it comes and goes.  Worse the last 2.5 weeks.    Review of Systems  BP 127/69 mmHg  Pulse 81  Wt 306 lb (138.801 kg)  SpO2 96%    Allergies  Allergen Reactions  . Influenza Vac Split [Flu Virus Vaccine] Other (See Comments)    Bell's Palsy    Past Medical History  Diagnosis Date  . Obesity   . Hypothyroidism   . Hypertension   . Diabetes mellitus   . Blood transfusion   . Anemia   . Asthma   . Chills   . Cough   . Blood in urine   . Fainting   . Weakness generalized   . Fatigue   . Abdominal pain   . Vitamin deficiency   . Jaundice   . Depression   . Shortness of breath   . Renal insufficiency   . GERD (gastroesophageal reflux disease)     Past Surgical History  Procedure Laterality Date  . Cardiac catheterization  2005  . Appendectomy  1988  . Roux-en-y gastric bypass  1-12    Dr Johna Sheriff  . Cholecystectomy  05/08/11  . Esophagogastroduodenoscopy  08/07/2011    Procedure: ESOPHAGOGASTRODUODENOSCOPY (EGD);  Surgeon: Kandis Cocking, MD;  Location: [facility];  Service: Endoscopy;  Laterality: N/A;    Social History   Social History  . Marital Status: Married    Spouse Name: N/A  . Number of Children: N/A  . Years of Education: N/A   Occupational History  . Not on file.   Social History Main Topics  . Smoking  status: Never Smoker   . Smokeless tobacco: Never Used  . Alcohol Use: No  . Drug Use: No  . Sexual Activity: Not on file   Other Topics Concern  . Not on file   Social History Narrative    Family History  Problem Relation Age of Onset  . Hyperlipidemia Mother   . Hypertension Mother   . Hyperlipidemia Father   . Hypertension Father     Outpatient Encounter Prescriptions as of 09/07/2015  Medication Sig  . amLODipine (NORVASC) 5 MG tablet Take 1 tablet (5 mg total) by mouth daily.  Marland Kitchen buPROPion (WELLBUTRIN XL) 300 MG 24 hr tablet Take 1 tablet (300 mg total) by mouth every morning.  . [DISCONTINUED] amLODipine (NORVASC) 5 MG tablet Take 1 tablet (5 mg total) by mouth daily.  . [DISCONTINUED] buPROPion (WELLBUTRIN XL) 150 MG 24 hr tablet Take 1 tablet (150 mg total) by mouth every morning.   No facility-administered encounter medications on file as of 09/07/2015.          Objective:   Physical Exam  Constitutional: He is oriented to person, place, and time. He appears well-developed and  well-nourished.  HENT:  Head: Normocephalic and atraumatic.  Cardiovascular: Normal rate, regular rhythm and normal heart sounds.   Pulmonary/Chest: Effort normal and breath sounds normal.  Neurological: He is alert and oriented to person, place, and time.  Skin: Skin is warm and dry.  Psychiatric: He has a normal mood and affect. His behavior is normal.          Assessment & Plan:  HTN- well controlled on amlodipine 5 mg.  Continue current regimen. F/U in 6 months.    Depression, acute - PHQ 9 core of 10, down from previous of 22.  We discussed several option including changing the medication bc of inc appetitie.  He says he wants to continue it for now and work on diet.   Will inc wellbutrin to .  A/U in 6 weeks    Dysphagia-he actually has a thyroid biopsy scheduled on Monday because of some abnormalities seen. We discussed moving forward with further workup with GI for  endoscopy after that. He wanted to hold off until his workup for thyroid is complete.  Abnormal thyroid imaging-biopsy scheduled for Monday.

## 2015-09-09 ENCOUNTER — Other Ambulatory Visit: Payer: Self-pay | Admitting: General Surgery

## 2015-09-12 ENCOUNTER — Ambulatory Visit (HOSPITAL_COMMUNITY)
Admission: RE | Admit: 2015-09-12 | Discharge: 2015-09-12 | Disposition: A | Discharge status: Home / Self Care | Payer: BC Managed Care – PPO | Source: Ambulatory Visit | Attending: Family Medicine | Admitting: Family Medicine

## 2015-09-12 DIAGNOSIS — E041 Nontoxic single thyroid nodule: Secondary | ICD-10-CM | POA: Insufficient documentation

## 2015-09-12 DIAGNOSIS — R946 Abnormal results of thyroid function studies: Secondary | ICD-10-CM

## 2015-09-12 DIAGNOSIS — Z808 Family history of malignant neoplasm of other organs or systems: Secondary | ICD-10-CM

## 2015-09-12 MED ORDER — LIDOCAINE HCL (PF) 1 % IJ SOLN
INTRAMUSCULAR | Status: AC
  Filled 2015-09-12: qty 10

## 2015-09-14 ENCOUNTER — Other Ambulatory Visit: Payer: Self-pay

## 2015-09-14 DIAGNOSIS — R896 Abnormal cytological findings in specimens from other organs, systems and tissues: Secondary | ICD-10-CM

## 2015-10-07 ENCOUNTER — Ambulatory Visit: Payer: Self-pay | Admitting: Gastroenterology

## 2015-10-20 ENCOUNTER — Ambulatory Visit (INDEPENDENT_AMBULATORY_CARE_PROVIDER_SITE_OTHER): Payer: Self-pay | Admitting: Family Medicine

## 2015-10-20 DIAGNOSIS — F329 Major depressive disorder, single episode, unspecified: Secondary | ICD-10-CM

## 2015-10-21 NOTE — Progress Notes (Signed)
   Subjective:    Patient ID: Casey Fletcher, male    DOB: 09-Nov-1975, 40 y.o.   MRN: [id number]  HPI    Review of Systems     Objective:   Physical Exam        Assessment & Plan:  Patient has been deemed a "no-show" for today's scheduled appointment.  Based on chief complaint and my chart review: -Follow-up advised.  Contacting patient and urged to schedule visit in 1-2  weeks.  If necessary this was communicated to the patient via phone or mail on: 8:37 AM 10/21/2015

## 2015-11-01 ENCOUNTER — Ambulatory Visit: Payer: Self-pay | Admitting: Family Medicine

## 2015-12-13 ENCOUNTER — Other Ambulatory Visit: Payer: Self-pay | Admitting: Family Medicine

## 2015-12-30 ENCOUNTER — Other Ambulatory Visit: Payer: Self-pay | Admitting: Physician Assistant

## 2015-12-30 ENCOUNTER — Telehealth: Payer: Self-pay

## 2015-12-30 MED ORDER — BUPROPION HCL ER (XL) 300 MG PO TB24: 300.0000 mg | ORAL_TABLET | Freq: Every morning | ORAL | Status: DC

## 2015-12-30 NOTE — Telephone Encounter (Signed)
I sent wellbutrin.

## 2016-01-03 ENCOUNTER — Ambulatory Visit (INDEPENDENT_AMBULATORY_CARE_PROVIDER_SITE_OTHER): Payer: BC Managed Care – PPO | Admitting: Physician Assistant

## 2016-01-03 ENCOUNTER — Encounter: Payer: Self-pay | Admitting: Physician Assistant

## 2016-01-03 VITALS — BP 118/75 | HR 78 | Wt 304.0 lb

## 2016-01-03 DIAGNOSIS — G4700 Insomnia, unspecified: Secondary | ICD-10-CM | POA: Diagnosis not present

## 2016-01-03 DIAGNOSIS — F329 Major depressive disorder, single episode, unspecified: Secondary | ICD-10-CM

## 2016-01-03 DIAGNOSIS — F32A Depression, unspecified: Secondary | ICD-10-CM | POA: Insufficient documentation

## 2016-01-03 DIAGNOSIS — I10 Essential (primary) hypertension: Secondary | ICD-10-CM | POA: Diagnosis not present

## 2016-01-03 MED ORDER — AMLODIPINE BESYLATE 5 MG PO TABS: 5.0000 mg | ORAL_TABLET | Freq: Every day | ORAL | Status: DC

## 2016-01-03 MED ORDER — ZOLPIDEM TARTRATE 10 MG PO TABS: 10.0000 mg | ORAL_TABLET | Freq: Every evening | ORAL | Status: DC | PRN

## 2016-01-03 MED ORDER — BUPROPION HCL ER (XL) 300 MG PO TB24: 300.0000 mg | ORAL_TABLET | Freq: Every morning | ORAL | Status: DC

## 2016-01-03 NOTE — Progress Notes (Addendum)
   Subjective:    Patient ID: Casey BeckerJavier Correa-Vega, male    DOB: [birth year], 40 y.o.   MRN: [id number]  HPI Patient is a 40 year old male who presents to the clinic for medication follow-up.  He is doing well on his Norvasc for blood pressure. He denies any chest pains, palpitations, headache or dizziness. He does request a 90 day refill as he is going to [place] to visit his family.  Patient is also on Wellbutrin for acute depression. Patient feels like he is doing great. He denies any suicidal or homicidal thoughts. He feels motivated and energized. He has no concerns today. He requests a 90 day prescription.  Patient's only concern is with help point to sleep. He was taking Ambien one half tab of the 10 mg. It was helping. He has been on for 3-4 months. He tried to find things over-the-counter and natural such as Unisom and melatonin to help get to sleep. Those did not seem to be effective. He has tried trazodone in the past and he seemed to be too much for him. He request 5 mg of Ambien to use to help with sleep.     Review of Systems  All other systems reviewed and are negative.      Objective:   Physical Exam  Constitutional: He is oriented to person, place, and time. He appears well-developed and well-nourished.  HENT:  Head: Normocephalic and atraumatic.  Cardiovascular: Normal rate, regular rhythm and normal heart sounds.   Pulmonary/Chest: Effort normal and breath sounds normal.  Neurological: He is alert and oriented to person, place, and time.  Skin: Skin is dry.  Psychiatric: He has a normal mood and affect. His behavior is normal.          Assessment & Plan:  HTN- refilled norvasc 90 day rx for 6 months.   Acute depression- PHQ-9 was 6 today. Pt is not sleeping well and effected screening. Refilled wellbutrin 90 for 6 months.   Insomnia- refilled ambien to take 1/2 tablet nightly. Follow up in 6 months. Discussed good sleep routine.   Printed off cytology of  thyroid biopsy. Will send to Dr. Shawnee KnappLevy for evaluation. Pt had appt but Dr. Shawnee KnappLevy did not have cytology.

## 2016-02-07 ENCOUNTER — Telehealth: Payer: Self-pay | Admitting: *Deleted

## 2016-02-07 NOTE — Telephone Encounter (Signed)
PA submitted over the phone and approved  For 3 years Auth # [id number].  Message left on patient's vm and pharm vm

## 2016-04-20 ENCOUNTER — Encounter (HOSPITAL_COMMUNITY): Payer: Self-pay

## 2016-06-11 ENCOUNTER — Other Ambulatory Visit: Payer: Self-pay | Admitting: Physician Assistant

## 2016-07-02 ENCOUNTER — Other Ambulatory Visit: Payer: Self-pay | Admitting: Physician Assistant

## 2016-07-04 ENCOUNTER — Ambulatory Visit: Payer: Self-pay | Admitting: Family Medicine

## 2016-07-26 DIAGNOSIS — N50812 Left testicular pain: Secondary | ICD-10-CM | POA: Insufficient documentation

## 2016-08-31 ENCOUNTER — Encounter: Payer: Self-pay | Admitting: Family Medicine

## 2016-08-31 ENCOUNTER — Ambulatory Visit (INDEPENDENT_AMBULATORY_CARE_PROVIDER_SITE_OTHER): Payer: BC Managed Care – PPO | Admitting: Family Medicine

## 2016-08-31 VITALS — BP 122/72 | HR 116 | Temp 98.3°F | Ht 76.0 in | Wt 312.0 lb

## 2016-08-31 DIAGNOSIS — J111 Influenza due to unidentified influenza virus with other respiratory manifestations: Secondary | ICD-10-CM

## 2016-08-31 MED ORDER — AMLODIPINE BESYLATE 5 MG PO TABS: 5.0000 mg | ORAL_TABLET | Freq: Every day | ORAL | 0 refills | Status: DC

## 2016-08-31 MED ORDER — OSELTAMIVIR PHOSPHATE 75 MG PO CAPS: 75.0000 mg | ORAL_CAPSULE | Freq: Two times a day (BID) | ORAL | 0 refills | Status: DC

## 2016-08-31 MED ORDER — ZOLPIDEM TARTRATE 10 MG PO TABS: ORAL_TABLET | ORAL | 1 refills | Status: DC

## 2016-08-31 MED ORDER — BUPROPION HCL ER (XL) 300 MG PO TB24
300.0000 mg | ORAL_TABLET | Freq: Every morning | ORAL | 0 refills | Status: DC
Start: 2016-08-31 — End: 2016-11-07

## 2016-08-31 NOTE — Patient Instructions (Addendum)

## 2016-08-31 NOTE — Progress Notes (Addendum)
   Subjective:    Patient ID: Casey Fletcher, male    DOB: 09/19/1975, 41 y.o.   MRN: [id number]  HPI 41 year old male comes in today complaining of fevers chills and runny nose x 48 hours.  + fatigue, + ST.   Daughter dx with Flu on Monday. Had flu shot in the fall.  No worsening or alleviating factors. Not currently taking medication except for Tylenol for fever.  Review of Systems     Objective:   Physical Exam  Constitutional: He is oriented to person, place, and time. He appears well-developed and well-nourished.  HENT:  Head: Normocephalic and atraumatic.  Right Ear: External ear normal.  Left Ear: External ear normal.  Nose: Nose normal.  Mouth/Throat: Oropharynx is clear and moist.  TMs and canals are clear.   Eyes: Conjunctivae and EOM are normal. Pupils are equal, round, and reactive to light.  Neck: Neck supple. No thyromegaly present.  Cardiovascular: Normal rate and normal heart sounds.   Pulmonary/Chest: Effort normal and breath sounds normal.  Lymphadenopathy:    He has no cervical adenopathy.  Neurological: He is alert and oriented to person, place, and time.  Skin: Skin is warm and dry.  Psychiatric: He has a normal mood and affect.          Assessment & Plan:  Influenza-we'll treat with Tamiflu. Okay for treatment with medications for current symptoms per call if not significantly better in one week.  I did go ahead and refill his medications but encouraged him to follow-up in about 2 months for regular office visit and lab work.

## 2016-10-30 ENCOUNTER — Other Ambulatory Visit: Payer: Self-pay | Admitting: Family Medicine

## 2016-10-30 ENCOUNTER — Encounter: Payer: Self-pay | Admitting: *Deleted

## 2016-11-07 ENCOUNTER — Other Ambulatory Visit: Payer: Self-pay | Admitting: Family Medicine

## 2017-01-25 ENCOUNTER — Encounter: Payer: Self-pay | Admitting: Sports Medicine

## 2017-01-25 ENCOUNTER — Ambulatory Visit (INDEPENDENT_AMBULATORY_CARE_PROVIDER_SITE_OTHER): Payer: BC Managed Care – PPO | Admitting: Sports Medicine

## 2017-01-25 DIAGNOSIS — M722 Plantar fascial fibromatosis: Secondary | ICD-10-CM | POA: Diagnosis not present

## 2017-01-25 NOTE — Assessment & Plan Note (Signed)
Severe pain, guided injection as above, physical therapy, return for custom orthotics, seated duty for one week. Avoid barefoot walking around the house.

## 2017-01-25 NOTE — Progress Notes (Signed)
   Subjective:    I'm seeing this patient as a consultation for:  Dr. 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 He tends to walk barefoot quite a bit, has really never had this treated before. Pain is severe, persistent, localized without radiation.  Past medical history, Surgical history, Family history not pertinant except as noted below, Social history, Allergies, and medications have been entered into the medical record, reviewed, and no changes needed.   Review of Systems: No headache, visual changes, nausea, vomiting, diarrhea, constipation, dizziness, abdominal pain, skin rash, fevers, chills, night sweats, weight loss, swollen lymph nodes, body aches, joint swelling, muscle aches, chest pain, shortness of breath, mood changes, visual or auditory hallucinations.   Objective:   General: Well Developed, well nourished, and in no acute distress.  Neuro:  Extra-ocular muscles intact, able to move all 4 extremities, sensation grossly intact.  Deep tendon reflexes tested were normal. Psych: Alert and oriented, mood congruent with affect. ENT:  Ears and nose appear unremarkable.  Hearing grossly normal. Neck: Unremarkable overall appearance, trachea midline.  No visible thyroid enlargement. Eyes: Conjunctivae and lids appear unremarkable.  Pupils equal and round. Skin: Warm and dry, no rashes noted.  Cardiovascular: Pulses palpable, no extremity edema. Right Foot: No visible erythema or swelling. Range of motion is full in all directions. Strength is 5/5 in all directions. No hallux valgus. No pes cavus or pes planus. No abnormal callus noted. No pain over the navicular prominence, or base of fifth metatarsal. Exquisite tenderness to palpation of the calcaneal insertion of plantar fascia. No pain  at the Achilles insertion. No pain over the calcaneal bursa. No pain of the retrocalcaneal bursa. No tenderness to palpation over the tarsals, metatarsals, or phalanges. No hallux rigidus or limitus. No tenderness palpation over interphalangeal joints. No pain with compression of the metatarsal heads. Neurovascularly intact distally.  Procedure: Real-time Ultrasound Guided Injection of right plantar fascia origin Device: GE Logiq E  Verbal informed consent obtained.  Time-out conducted.  Noted no overlying erythema, induration, or other signs of local infection.  Skin prepped in a sterile fashion.  Local anesthesia: Topical Ethyl chloride.  With sterile technique and under real time ultrasound guidance:  25-gauge needle advanced to the origin of the calcaneus and the plantar fascia, 1 mL Kenalog 40, 1 mL lidocaine, 1 mL bupivacaine injected easily Completed without difficulty  Pain immediately resolved suggesting accurate placement of the medication.  Advised to call if fevers/chills, erythema, induration, drainage, or persistent bleeding.  Images permanently stored and available for review in the ultrasound unit.  Impression: Technically successful ultrasound guided injection.  Impression and Recommendations:   This case required medical decision making of moderate complexity.  Plantar fasciitis, right Severe pain, guided injection as above, physical therapy, return for custom orthotics, seated duty for one week. Avoid barefoot walking around the house.

## 2017-02-01 ENCOUNTER — Encounter: Payer: Self-pay | Admitting: Sports Medicine

## 2017-02-01 ENCOUNTER — Ambulatory Visit: Payer: Self-pay | Admitting: Family Medicine

## 2017-02-07 ENCOUNTER — Ambulatory Visit: Payer: Self-pay | Admitting: Physician Assistant

## 2017-02-07 DIAGNOSIS — Z0189 Encounter for other specified special examinations: Secondary | ICD-10-CM

## 2017-02-20 ENCOUNTER — Encounter (HOSPITAL_COMMUNITY): Payer: Self-pay

## 2017-03-04 ENCOUNTER — Other Ambulatory Visit: Payer: Self-pay | Admitting: Family Medicine

## 2017-03-04 MED ORDER — AMLODIPINE BESYLATE 5 MG PO TABS: 5.0000 mg | ORAL_TABLET | Freq: Every day | ORAL | 0 refills | Status: DC

## 2017-03-04 MED ORDER — BUPROPION HCL ER (XL) 300 MG PO TB24: 300.0000 mg | ORAL_TABLET | Freq: Every morning | ORAL | 0 refills | Status: DC

## 2017-03-04 NOTE — Progress Notes (Signed)
Wife is here for today for her office visit and requested refills. He is working 2 jobs and has had difficulty making an appointment. He did come in recently and saw one of my partners for a foot problem and at the time his blood pressure was well controlled. We'll go ahead and refill his amlodipine and Wellbutrin but let him know that he needs to make an appointment as soon as possible to get in his been over a year since we have seen him for these medical conditions and he needs up-to-date blood work.

## 2017-03-26 ENCOUNTER — Ambulatory Visit (INDEPENDENT_AMBULATORY_CARE_PROVIDER_SITE_OTHER): Payer: BC Managed Care – PPO | Admitting: Family Medicine

## 2017-03-26 ENCOUNTER — Encounter: Payer: Self-pay | Admitting: Family Medicine

## 2017-03-26 VITALS — BP 122/69 | HR 94 | Temp 98.6°F | Ht 74.0 in | Wt 303.0 lb

## 2017-03-26 DIAGNOSIS — F329 Major depressive disorder, single episode, unspecified: Secondary | ICD-10-CM | POA: Diagnosis not present

## 2017-03-26 DIAGNOSIS — J069 Acute upper respiratory infection, unspecified: Secondary | ICD-10-CM | POA: Diagnosis not present

## 2017-03-26 DIAGNOSIS — G4700 Insomnia, unspecified: Secondary | ICD-10-CM

## 2017-03-26 DIAGNOSIS — I10 Essential (primary) hypertension: Secondary | ICD-10-CM | POA: Diagnosis not present

## 2017-03-26 DIAGNOSIS — F32A Depression, unspecified: Secondary | ICD-10-CM

## 2017-03-26 MED ORDER — ESZOPICLONE 2 MG PO TABS: 2.0000 mg | ORAL_TABLET | Freq: Every evening | ORAL | 1 refills | Status: DC | PRN

## 2017-03-26 MED ORDER — ZOLPIDEM TARTRATE 10 MG PO TABS: 10.0000 mg | ORAL_TABLET | Freq: Every evening | ORAL | 1 refills | Status: DC | PRN

## 2017-03-26 NOTE — Patient Instructions (Addendum)
Upper Respiratory Infection, Adult Most upper respiratory infections (URIs) are caused by a virus. A URI affects the nose, throat, and upper air passages. The most common type of URI is often called "the common cold." Follow these instructions at home:  Take medicines only as told by your doctor.  Gargle warm saltwater or take cough drops to comfort your throat as told by your doctor.  Use a warm mist humidifier or inhale steam from a shower to increase air moisture. This may make it easier to breathe.  Drink enough fluid to keep your pee (urine) clear or pale yellow.  Eat soups and other clear broths.  Have a healthy diet.  Rest as needed.  Go back to work when your fever is gone or your doctor says it is okay. ? You may need to stay home longer to avoid giving your URI to others. ? You can also wear a face mask and wash your hands often to prevent spread of the virus.  Use your inhaler more if you have asthma.  Do not use any tobacco products, including cigarettes, chewing tobacco, or electronic cigarettes. If you need help quitting, ask your doctor. Contact a doctor if:  You are getting worse, not better.  Your symptoms are not helped by medicine.  You have chills.  You are getting more short of breath.  You have brown or red mucus.  You have yellow or brown discharge from your nose.  You have pain in your face, especially when you bend forward.  You have a fever.  You have puffy (swollen) neck glands.  You have pain while swallowing.  You have white areas in the back of your throat. Get help right away if:  You have very bad or constant: ? Headache. ? Ear pain. ? Pain in your forehead, behind your eyes, and over your cheekbones (sinus pain). ? Chest pain.  You have long-lasting (chronic) lung disease and any of the following: ? Wheezing. ? Long-lasting cough. ? Coughing up blood. ? A change in your usual mucus.  You have a stiff neck.  You have  changes in your: ? Vision. ? Hearing. ? Thinking. ? Mood. This information is not intended to replace advice given to you by your health care provider. Make sure you discuss any questions you have with your health care provider. Document Released: 12/19/2007 Document Revised: 03/04/2016 Document Reviewed: 10/07/2013 Elsevier Interactive Patient Education  2018 Elsevier Inc.  

## 2017-03-26 NOTE — Progress Notes (Signed)
Subjective:    CC: BP, sleep and cold sxs.   HPI:  Hypertension- Pt denies chest pain, SOB, dizziness, or heart palpitations.  Taking meds as directed w/o problems.  Denies medication side effects.    Insomnia - Uses ambien PRN at bedtime.    Depression - Currently on Wellbutrin for mood. Tolerating medication well without any side effects. Denieressive sympturrently.  He also c/o of URI sxs. He c/o of nasal congestion, runny nose and coughing. He reports having some chills last night but did not actually check his temperature. Positive sick contacts within the home.   Past medical history, Surgical history, Family history not pertinant except as noted below, Social history, Allergies, and medications have been entered into the medical record, reviewed, and corrections made.   Review of Systems: No fevers, chills, night sweats, weight loss, chest pain, or shortness of breath.   Objective:    General: Well Developed, well nourished, and in no acute distress.  Neuro: Alert and oriented x3, extra-ocular muscles intact, sensation grossly intact.  HEENT: Normocephalic, atraumatic, oropharynx is mildly erythematous, no significant cervical lymphadenopathy. Some tenderness over the maxillary sinuses.  Skin: Warm and dry, no rashes. Cardiac: Regular rate and rhythm, no murmurs rubs or gallops, no lower extremity edema.  Respiratory: Clear to auscultation bilaterally. Not using accessory muscles, speaking in full sentences.   Impression and Recommendations:    HTN - Well controlled. Continue current regimen. Follow up in  6 months.    Insmonia - change to lunesta bc of sleep eating on [place].   Depression - well controlled. PHQ 9 score of 0 today.  URI - likely viral . rec symptomatic are.  Call if not better by Monday.

## 2017-04-01 ENCOUNTER — Telehealth: Payer: Self-pay | Admitting: *Deleted

## 2017-04-01 MED ORDER — AMOXICILLIN-POT CLAVULANATE 875-125 MG PO TABS: 1.0000 | ORAL_TABLET | Freq: Two times a day (BID) | ORAL | 0 refills | Status: DC

## 2017-04-01 NOTE — Telephone Encounter (Signed)
Pt called and lvm stating that he is not any better and asked that I return call.Loralee Pacas [place]

## 2017-04-01 NOTE — Telephone Encounter (Signed)
Called pt back. lvm asking that he return call.Loralee Pacas [place]

## 2017-04-01 NOTE — Telephone Encounter (Signed)
Called and informed pt .Casey Fletcher  

## 2017-04-01 NOTE — Telephone Encounter (Signed)
Ok, new prescription sent to [facility]. This iich should help clear up remaining symptoms.

## 2017-04-01 NOTE — Telephone Encounter (Signed)
Pt called back and stated that he is not feeling any better. He stated that he has a lot of sinus pressure, runny nose, watery eyes. Denies any fever. Pt states that if he doesn't answer the phone it's ok to lvm .Casey Fletcher

## 2017-05-09 ENCOUNTER — Encounter: Payer: Self-pay | Admitting: Family Medicine

## 2017-05-09 ENCOUNTER — Ambulatory Visit (INDEPENDENT_AMBULATORY_CARE_PROVIDER_SITE_OTHER): Payer: BC Managed Care – PPO | Admitting: Family Medicine

## 2017-05-09 ENCOUNTER — Ambulatory Visit (INDEPENDENT_AMBULATORY_CARE_PROVIDER_SITE_OTHER): Payer: BC Managed Care – PPO

## 2017-05-09 VITALS — BP 112/74 | HR 78

## 2017-05-09 DIAGNOSIS — S99921A Unspecified injury of right foot, initial encounter: Secondary | ICD-10-CM

## 2017-05-09 DIAGNOSIS — Z23 Encounter for immunization: Secondary | ICD-10-CM | POA: Diagnosis not present

## 2017-05-09 DIAGNOSIS — X58XXXA Exposure to other specified factors, initial encounter: Secondary | ICD-10-CM | POA: Diagnosis not present

## 2017-05-09 NOTE — Progress Notes (Signed)
Amour Cutrone is a 41 y.o. male who presents to [facility] today for right toe injury.  Patient stopped his pinky toe a few days ago at home.  He notes pain and bruising especially with ambulation and tight shoes.  He denies any radiating pain weakness or chills and feels well otherwise.  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   Past Medical History:  Diagnosis Date  . Abdominal pain   . Anemia   . Asthma   . Blood in urine   . Blood transfusion   . Chills   . Cough   . Depression   . Diabetes mellitus   . Fainting   . Fatigue   . GERD (gastroesophageal reflux disease)   . Hypertension   . Hypothyroidism   . Jaundice   . Obesity   . Renal insufficiency   . Shortness of breath   . Vitamin deficiency   . Weakness generalized    Past Surgical History:  Procedure Laterality Date  . APPENDECTOMY  1988  . CARDIAC CATHETERIZATION  2005  . CHOLECYSTECTOMY  05/08/11  . ESOPHAGOGASTRODUODENOSCOPY  08/07/2011   Procedure: ESOPHAGOGASTRODUODENOSCOPY (EGD);  Surgeon: Kandis Cocking, MD;  Location: [facility];  Service: Endoscopy;  Laterality: N/A;  . 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 Smoking status: Never Smoker  . Smokeless tobacco: Never Used  . Alcohol use No     ROS:  As above   Medications: Current Outpatient Prescriptions  Medication Sig Dispense Refill  . amLODipine (NORVASC) 5 MG tablet Take 1 tablet (5 mg total) by mouth daily. 90 tablet 0  . buPROPion (WELLBUTRIN XL) 300 MG 24 hr tablet Take 1 tablet (300 mg total) by mouth every morning. 90 tablet 0  . eszopiclone (LUNESTA) 2 MG TABS tablet Take 1 tablet (2 mg total) by mouth at bedtime as needed for sleep. Take immediately before bedtime 30 tablet 1   No current facility-administered medications for this visit.    Allergies  Allergen Reactions   . Influenza Vac Split [Flu Virus Vaccine] Other (See Comments)    Bell's Palsy     Exam:  BP 112/74   Pulse 78  General: Well Developed, well nourished, and in no acute distress.  Neuro/Psych: Alert and oriented x3, extra-ocular muscles intact, able to move all 4 extremities, sensation grossly intact. Skin: Warm and dry, no rashes noted.  Respiratory: Not using accessory muscles, speaking in full sentences, trachea midline.  Cardiovascular: Pulses palpable, no extremity edema. Abdomen: Does not appear distended. MSK: Right foot ecchymosis at the fifth MTP.  Otherwise the foot is normal-appearing Tender to palpation fifth toe and proximal fifth metatarsal.  Pulses capillary refill and sensation are intact.  X-ray right foot reveals a possible nondisplaced fracture of the distal phalanx of the right fifth toe otherwise unremarkable. Awaiting formal radiology review    No results found for this or any previous visit (from the past 48 hour(s)). No results found.    Assessment and Plan: 41 y.o. male with right toe fracture. Plan for buddy tape and relative rest.  Recheck in 2 weeks if not improving.  Return sooner if needed.  Influenza vaccine given today prior to discharge.    Orders Placed This Encounter  Procedures  . DG Foot Complete Right    Standing Status:  Future    Number of Occurrences:   1    Standing Expiration Date:   07/09/2018    Order Specific Question:   Reason for Exam (SYMPTOM  OR DIAGNOSIS REQUIRED)    Answer:   eval ?fracture 5th toe    Order Specific Question:   Preferred imaging location?    [facility]    Order Specific Question:   Radiology Contrast Protocol - do NOT remove file path    Answer:   [email or web address]  . Flu Vaccine QUAD 36+ mos IM   No orders of the defined types were placed in this encounter.   Discussed warning signs or symptoms. Please see discharge instructions.  Patient expresses understanding.

## 2017-05-09 NOTE — Patient Instructions (Signed)
Thank you for coming in today. Keep the toes taped.  Use comfortable shoes.  Recheck in 2 weeks if not better.    How to Buddy Tape Buddy taping refers to taping an injured finger or toe to an uninjured finger or toe that is next to it. This protects the injured finger or toe and keeps it from moving while the injury heals. You may buddy tape a finger or toe if you have a minor sprain. Your health care provider may buddy tape your finger or toe if you have a sprain, dislocation, or fracture. You may be told to replace your buddy taping as needed. What are the risks? Generally, buddy taping is safe. However, problems may occur, such as:  Skin injury or infection.  Reduced blood flow to the finger or toe.  Skin reaction to the tape.  Do not buddy tape your toe if you have diabetes. Do not buddy tape if you know that you have an allergy to adhesives or surgical tape. How to buddy tape Before Buddy Taping Try to reduce any pain and swelling with rest, icing, and elevation:  Avoid any activity that causes pain.  Raise (elevate) your hand or foot above the level of your heart while you are sitting or lying down.  If directed, apply ice to the injured area: ? Put ice in a plastic bag. ? Place a towel between your skin and the bag. ? Leave the ice on for 20 minutes, 2-3 times per day.  Buddy Taping Procedure  Clean and dry your finger or toe as told by your health care provider.  Place a gauze pad or a piece of cloth or cotton between your injured finger or toe and the uninjured finger or toe.  Use tape to wrap around both fingers or toes so your injured finger or toe is secured to the uninjured finger or toe. ? The tape should be snug, but not tight. ? Make sure the ends of the piece of tape overlap. ? Avoid placing tape directly over the joint.  Change the tape and the padding as told by your health care provider. Remove and replace the tape or padding if it becomes loose, worn,  dirty, or wet. After Buddy Taping  Take over-the-counter and prescription medicines only as told by your health care provider.  Return to your normal activities as told by your health care provider. Ask your health care provider what activities are safe for you.  Watch the buddy-taped area and always remove buddy taping if: ? Your pain gets worse. ? Your fingers turn pale or blue. ? Your skin becomes irritated. Contact a health care provider if:  You have pain, swelling, or bruising that lasts longer than three days.  You have a fever.  Your skin is red, cracked, or irritated. Get help right away if:  The injured area becomes cold, numb, or pale.  You have severe pain, swelling, bruising, or loss of movement in your finger or toe.  Your finger or toe changes shape (deformity). This information is not intended to replace advice given to you by your health care provider. Make sure you discuss any questions you have with your health care provider. Document Released: 08/09/2004 Document Revised: 12/08/2015 Document Reviewed: 11/24/2014 Elsevier Interactive Patient Education  2018 Elsevier Inc.    Toe Fracture A toe fracture is a break in one of the toe bones (phalanges). What are the causes? This condition may be caused by:  Dropping a heavy object  on your toe.  Stubbing your toe.  Overusing your toe or doing repetitive exercise.  Twisting or stretching your toe out of place.  What increases the risk? This condition is more likely to develop in people who:  Play contact sports.  Have a bone disease.  Have a low calcium level.  What are the signs or symptoms? The main symptoms of this condition are swelling and pain in the toe. The pain may get worse with standing or walking. Other symptoms include:  Bruising.  Stiffness.  Numbness.  A change in the way the toe looks.  Broken bones that poke through the skin.  Blood beneath the toenail.  How is this  diagnosed? This condition is diagnosed with a physical exam. You may also have X-rays. How is this treated? Treatment for this condition depends on the type of fracture and its severity. Treatment may involve:  Taping the broken toe to a toe that is next to it (buddy taping). This is the most common treatment for fractures in which the bone has not moved out of place (nondisplaced fracture).  Wearing a shoe that has a wide, rigid sole to protect the toe and to limit its movement.  Wearing a walking cast.  Having a procedure to move the toe back into place.  Surgery. This may be needed: ? If there are many pieces of broken bone that are out of place (displaced). ? If the toe joint breaks. ? If the bone breaks through the skin.  Physical therapy. This is done to help regain movement and strength in the toe.  You may need follow-up X-rays to make sure that the bone is healing well and staying in position. Follow these instructions at home: If you have a cast:  Do not stick anything inside the cast to scratch your skin. Doing that increases your risk of infection.  Check the skin around the cast every day. Report any concerns to your health care provider. You may put lotion on dry skin around the edges of the cast. Do not apply lotion to the skin underneath the cast.  Do not put pressure on any part of the cast until it is fully hardened. This may take several hours.  Keep the cast clean and dry. Bathing  Do not take baths, swim, or use a hot tub until your health care provider approves. Ask your health care provider if you can take showers. You may only be allowed to take sponge baths for bathing.  If your health care provider approves bathing and showering, cover the cast or bandage (dressing) with a watertight plastic bag to protect it from water. Do not let the cast or dressing get wet. Managing pain, stiffness, and swelling  If you do not have a cast, apply ice to the injured  area, if directed. ? Put ice in a plastic bag. ? Place a towel between your skin and the bag. ? Leave the ice on for 20 minutes, 2-3 times per day.  Move your toes often to avoid stiffness and to lessen swelling.  Raise (elevate) the injured area above the level of your heart while you are sitting or lying down. Driving  Do not drive or operate heavy machinery while taking pain medicine.  Do not drive while wearing a cast on a foot that you use for driving. Activity  Return to your normal activities as directed by your health care provider. Ask your health care provider what activities are safe for you.  Perform exercises daily as directed by your health care provider or physical therapist. Safety  Do not use the injured limb to support your body weight until your health care provider says that you can. Use crutches or other assistive devices as directed by your health care provider. General instructions  If your toe was treated with buddy taping, follow your health care provider's instructions for changing the gauze and tape. Change it more often: ? The gauze and tape get wet. If this happens, dry the space between the toes. ? The gauze and tape are too tight and cause your toe to become pale or numb.  Wear a protective shoe as directed by your health care provider. If you were not given a protective shoe, wear sturdy, supportive shoes. Your shoes should not pinch your toes and should not fit tightly against your toes.  Do not use any tobacco products, including cigarettes, chewing tobacco, or e-cigarettes. Tobacco can delay bone healing. If you need help quitting, ask your health care provider.  Take medicines only as directed by your health care provider.  Keep all follow-up visits as directed by your health care provider. This is important. Contact a health care provider if:  You have a fever.  Your pain medicine is not helping.  Your toe is cold.  Your toe is  numb.  You still have pain after one week of rest and treatment.  You still have pain after your health care provider has said that you can start walking again.  You have pain, tingling, or numbness in your foot that is not going away. Get help right away if:  You have severe pain.  You have redness or inflammation in your toe that is getting worse.  You have pain or numbness in your toe that is getting worse.  Your toe turns blue. This information is not intended to replace advice given to you by your health care provider. Make sure you discuss any questions you have with your health care provider. Document Released: 06/29/2000 Document Revised: 03/05/2016 Document Reviewed: 04/28/2014 Elsevier Interactive Patient Education  [employer]2018 [employer] Inc.

## 2017-05-23 ENCOUNTER — Other Ambulatory Visit: Payer: Self-pay | Admitting: Family Medicine

## 2017-05-29 ENCOUNTER — Other Ambulatory Visit: Payer: Self-pay | Admitting: Family Medicine

## 2017-06-19 ENCOUNTER — Other Ambulatory Visit: Payer: Self-pay | Admitting: *Deleted

## 2017-06-19 ENCOUNTER — Encounter: Payer: Self-pay | Admitting: Sports Medicine

## 2017-06-19 ENCOUNTER — Ambulatory Visit (INDEPENDENT_AMBULATORY_CARE_PROVIDER_SITE_OTHER): Payer: BC Managed Care – PPO | Admitting: Sports Medicine

## 2017-06-19 DIAGNOSIS — G4700 Insomnia, unspecified: Secondary | ICD-10-CM

## 2017-06-19 DIAGNOSIS — M722 Plantar fascial fibromatosis: Secondary | ICD-10-CM | POA: Diagnosis not present

## 2017-06-19 MED ORDER — ESZOPICLONE 2 MG PO TABS: ORAL_TABLET | ORAL | 3 refills | Status: DC

## 2017-06-19 NOTE — Assessment & Plan Note (Signed)
Injection previously 4-1/2 months ago, having recurrence of pain but never return for custom molded orthotics and really did not do the exercises. I have emphasized the importance of orthotics and the importance of doing the stretches, repeat injection today. Return for orthotics.

## 2017-06-19 NOTE — Progress Notes (Signed)
  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  Now having a recurrence of pain, severe, persistent, localized at the right origin of the plantar fascia without radiation.  Past medical history:  Negative.  See flowsheet/record as well for more information.  Surgical history: Negative.  See flowsheet/record as well for more information.  Family history: Negative.  See flowsheet/record as well for more information.  Social history: Negative.  See flowsheet/record as well for more information.  Allergies, and medications have been entered into the medical record, reviewed, and no changes needed.   Review of Systems: No fevers, chills, night sweats, weight loss, chest pain, or shortness of breath.   Objective:    General: Well Developed, well nourished, and in no acute distress.  Neuro: Alert and oriented x3, extra-ocular muscles intact, sensation grossly intact.  HEENT: Normocephalic, atraumatic, pupils equal round reactive to light, neck supple, no masses, no lymphadenopathy, thyroid nonpalpable.  Skin: Warm and dry, no rashes. Cardiac: Regular rate and rhythm, no murmurs rubs or gallops, no lower extremity edema.  Respiratory: Clear to auscultation bilaterally. Not using accessory muscles, speaking in full sentences. Right foot: No visible erythema or swelling. Range of motion is full in all directions. Strength is 5/5 in all directions. No hallux valgus. No pes cavus or pes planus. No abnormal callus noted. No pain over the navicular prominence, or base of fifth metatarsal. Severe tenderness to palpation of the calcaneal insertion of plantar fascia. No pain at the Achilles insertion. No pain over the calcaneal bursa. No pain of the retrocalcaneal bursa. No tenderness to palpation over the  tarsals, metatarsals, or phalanges. No hallux rigidus or limitus. No tenderness palpation over interphalangeal joints. No pain with compression of the metatarsal heads. Neurovascularly intact distally.  Procedure: Real-time Ultrasound Guided Injection of right plantar fascial origin Device: GE Logiq E  Verbal informed consent obtained.  Time-out conducted.  Noted no overlying erythema, induration, or other signs of local infection.  Skin prepped in a sterile fashion.  Local anesthesia: Topical Ethyl chloride.  With sterile technique and under real time ultrasound guidance: 25-gauge needle advanced to the origin of the calcaneus and the plantar fascia, 1 cc kenalog 40, 1 cc lidocaine, 1 cc bupivacaine injected easily. Completed without difficulty  Pain immediately resolved suggesting accurate placement of the medication.  Advised to call if fevers/chills, erythema, induration, drainage, or persistent bleeding.  Images permanently stored and available for review in the ultrasound unit.  Impression: Technically successful ultrasound guided injection.  Impression and Recommendations:    Plantar fasciitis, right Injection previously 4-1/2 months ago, having recurrence of pain but never return for custom molded orthotics and really did not do the exercises. I have emphasized the importance of orthotics and the importance of doing the stretches, repeat injection today. Return for orthotics. ___________________________________________ Ihor Austinhomas J. Benjamin Stainhekkekandam, M.D., ABFM., CAQSM. Primary Care and Sports Medicine [facility]  Adjunct Instructor of Family Medicine  [employer]

## 2017-06-26 ENCOUNTER — Encounter: Payer: Self-pay | Admitting: Sports Medicine

## 2017-06-26 DIAGNOSIS — Z0189 Encounter for other specified special examinations: Secondary | ICD-10-CM

## 2017-08-02 ENCOUNTER — Encounter: Payer: Self-pay | Admitting: Physician Assistant

## 2017-08-02 ENCOUNTER — Ambulatory Visit (INDEPENDENT_AMBULATORY_CARE_PROVIDER_SITE_OTHER): Payer: BC Managed Care – PPO | Admitting: Physician Assistant

## 2017-08-02 VITALS — BP 137/88 | HR 102 | Temp 97.9°F | Wt 314.0 lb

## 2017-08-02 DIAGNOSIS — J111 Influenza due to unidentified influenza virus with other respiratory manifestations: Secondary | ICD-10-CM

## 2017-08-02 DIAGNOSIS — R69 Illness, unspecified: Secondary | ICD-10-CM | POA: Diagnosis not present

## 2017-08-02 DIAGNOSIS — R52 Pain, unspecified: Secondary | ICD-10-CM

## 2017-08-02 LAB — POCT INFLUENZA A/B
INFLUENZA A, POC: NEGATIVE
Influenza B, POC: NEGATIVE

## 2017-08-02 MED ORDER — BENZONATATE 200 MG PO CAPS
200.0000 mg | ORAL_CAPSULE | Freq: Three times a day (TID) | ORAL | 0 refills | Status: DC | PRN
Start: 2017-08-02 — End: 2017-10-09

## 2017-08-02 MED ORDER — OSELTAMIVIR PHOSPHATE 75 MG PO CAPS: 75.0000 mg | ORAL_CAPSULE | Freq: Two times a day (BID) | ORAL | 0 refills | Status: DC

## 2017-08-02 MED ORDER — IPRATROPIUM BROMIDE 0.06 % NA SOLN: 1.0000 | Freq: Four times a day (QID) | NASAL | 0 refills | Status: DC | PRN

## 2017-08-02 MED ORDER — GUAIFENESIN ER 600 MG PO TB12
600.0000 mg | ORAL_TABLET | Freq: Two times a day (BID) | ORAL | 0 refills | Status: DC
Start: 2017-08-02 — End: 2017-10-09

## 2017-08-02 NOTE — Progress Notes (Signed)
HPI:                                                                Casey Fletcher is a 42 y.o. male who presents to [facility]: Primary Care Sports Medicine today for flu-like symptoms  Influenza  This is a new problem. Episode onset: x 3 days. The problem occurs constantly. The problem has been gradually worsening. Associated symptoms include chills, congestion, coughing, diaphoresis, fatigue and a sore throat. Pertinent negatives include no abdominal pain or arthralgias. The symptoms are aggravated by exertion. He has tried acetaminophen for the symptoms.    No flowsheet data found.    Past Medical History:  Diagnosis Date  . Abdominal pain   . Anemia   . Asthma   . Blood in urine   . Blood transfusion   . Chills   . Cough   . Depression   . Diabetes mellitus   . Fainting   . Fatigue   . GERD (gastroesophageal reflux disease)   . Hypertension   . Hypothyroidism   . Jaundice   . Obesity   . Renal insufficiency   . Shortness of breath   . Vitamin deficiency   . Weakness generalized    Past Surgical History:  Procedure Laterality Date  . APPENDECTOMY  1988  . CARDIAC CATHETERIZATION  2005  . CHOLECYSTECTOMY  05/08/11  . ESOPHAGOGASTRODUODENOSCOPY  08/07/2011   Procedure: ESOPHAGOGASTRODUODENOSCOPY (EGD);  Surgeon: Kandis Cocking, MD;  Location: [facility];  Service: Endoscopy;  Laterality: N/A;  . 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 Smoking status: Never Smoker  . Smokeless tobacco: Never Used  Substance Use Topics  . Alcohol use: No   family history includes Hyperlipidemia in his father and mother; Hypertension in his father and mother.    ROS: negative except as noted in the HPI  Medications: Current Outpatient Medications  Medication Sig Dispense Refill  . amLODipine (NORVASC) 5 MG tablet Take 1 tablet (5 mg total) by mouth daily. 90 tablet 0  . buPROPion (WELLBUTRIN XL) 300 MG 24  hr tablet take 1 tablet by mouth every morning 90 tablet 1  . benzonatate (TESSALON) 200 MG capsule Take 1 capsule (200 mg total) by mouth 3 (three) times daily as needed for cough. 45 capsule 0  . eszopiclone (LUNESTA) 2 MG TABS tablet take 1 tablet by mouth immediately before bedtime if needed for sleep (Patient not taking: Reported on 08/02/2017) 30 tablet 3  . guaiFENesin (MUCINEX) 600 MG 12 hr tablet Take 1 tablet (600 mg total) by mouth 2 (two) times daily. 20 tablet 0  . ipratropium (ATROVENT) 0.06 % nasal spray Place 1 spray into both nostrils 4 (four) times daily as needed. 15 mL 0  . oseltamivir (TAMIFLU) 75 MG capsule Take 1 capsule (75 mg total) by mouth 2 (two) times daily. 10 capsule 0   No current facility-administered medications for this visit.    Allergies  Allergen Reactions  . Influenza Vac Split [Flu Virus Vaccine] Other (See Comments)    Bell's Palsy       Objective:  BP 137/88   Pulse (!) 102   Temp 97.9 F (36.6 C) (Oral) Comment:  patient had tylenol at 738 am  Wt (!) 314 lb (142.4 kg)   SpO2 99%   BMI 40.32 kg/m  Gen:  alert, ill-appearing, not toxic-appearing, no distress, appropriate for age, obese male HEENT: head normocephalic without obvious abnormality, TM's clear bilaterally, conjunctiva and cornea clear, wearing glasses, nasal mucosa edematous, oropharynx with erythema, no exudates, neck supple, no adenopathy, trachea midline Pulm: Normal work of breathing, normal phonation, clear to auscultation bilaterally, no wheezes, rales or rhonchi CV: mildly tachycardic, regular rhythm, s1 and s2 distinct, no murmurs, clicks or rubs  Neuro: alert and oriented x 3, no tremor MSK: extremities atraumatic, normal gait and station Skin: intact, no rashes on exposed skin, no jaundice, no cyanosis     Results for orders placed or performed in visit on 08/02/17 (from the past 72 hour(s))  POCT Influenza A/B     Status: Abnormal   Collection Time: 08/02/17 10:26  AM  Result Value Ref Range   Influenza A, POC Negative Negative   Influenza B, POC Negative Negative   No results found.    Assessment and Plan: 42 y.o. male with   1. Influenza-like illness - sudden onset myalgia, fever. He is in the window for Tamiflu. Will treat empirically. Symptomatic care. Work note provided to return Tuesday 08/06/17 - oseltamivir (TAMIFLU) 75 MG capsule; Take 1 capsule (75 mg total) by mouth 2 (two) times daily.  Dispense: 10 capsule; Refill: 0 - ipratropium (ATROVENT) 0.06 % nasal spray; Place 1 spray into both nostrils 4 (four) times daily as needed.  Dispense: 15 mL; Refill: 0 - benzonatate (TESSALON) 200 MG capsule; Take 1 capsule (200 mg total) by mouth 3 (three) times daily as needed for cough.  Dispense: 45 capsule; Refill: 0 - guaiFENesin (MUCINEX) 600 MG 12 hr tablet; Take 1 tablet (600 mg total) by mouth 2 (two) times daily.  Dispense: 20 tablet; Refill: 0  2. Generalized body aches - POCT Influenza A/B negative     Patient education and anticipatory guidance given Patient agrees with treatment plan Follow-up as needed if symptoms worsen or fail to improve  Levonne Hubertharley E. Cummings PA-C

## 2017-08-02 NOTE — Patient Instructions (Signed)

## 2017-08-26 ENCOUNTER — Other Ambulatory Visit: Payer: Self-pay | Admitting: Family Medicine

## 2017-09-19 DIAGNOSIS — H40003 Preglaucoma, unspecified, bilateral: Secondary | ICD-10-CM | POA: Insufficient documentation

## 2017-09-19 DIAGNOSIS — H0011 Chalazion right upper eyelid: Secondary | ICD-10-CM | POA: Insufficient documentation

## 2017-10-09 ENCOUNTER — Telehealth: Payer: Self-pay | Admitting: Family Medicine

## 2017-10-09 ENCOUNTER — Encounter: Payer: Self-pay | Admitting: Family Medicine

## 2017-10-09 ENCOUNTER — Ambulatory Visit (INDEPENDENT_AMBULATORY_CARE_PROVIDER_SITE_OTHER): Payer: BC Managed Care – PPO | Admitting: Family Medicine

## 2017-10-09 VITALS — BP 135/83 | HR 83 | Temp 98.2°F | Resp 16 | Ht 74.0 in | Wt 312.3 lb

## 2017-10-09 DIAGNOSIS — J029 Acute pharyngitis, unspecified: Secondary | ICD-10-CM | POA: Diagnosis not present

## 2017-10-09 DIAGNOSIS — G4700 Insomnia, unspecified: Secondary | ICD-10-CM | POA: Diagnosis not present

## 2017-10-09 DIAGNOSIS — R509 Fever, unspecified: Secondary | ICD-10-CM

## 2017-10-09 DIAGNOSIS — J069 Acute upper respiratory infection, unspecified: Secondary | ICD-10-CM | POA: Diagnosis not present

## 2017-10-09 LAB — POCT INFLUENZA A/B
INFLUENZA A, POC: NEGATIVE
INFLUENZA B, POC: NEGATIVE

## 2017-10-09 MED ORDER — ESZOPICLONE 2 MG PO TABS: ORAL_TABLET | ORAL | 3 refills | Status: DC

## 2017-10-09 NOTE — Telephone Encounter (Signed)
Received fax from [employer] that Lunesta requires a PA. Information has been sent to the insurance company. Awaiting determination.   

## 2017-10-09 NOTE — Progress Notes (Signed)
   Subjective:    Patient ID: Casey Fletcher, male    DOB: [birth year], 42 y.o.   MRN: [id number]  HPI 42 year old male is here today with cough fever and nasal congestion as well as generalized body aches.  He is having yellow productive mucus.  Is been using Tylenol for relief. No ST.  + nausea.  No vomiting.  He does work at the hospital.  He is unable to take the flu vaccine because of allergy.   He was seen in Jan for similar sxs.     Review of Systems     Objective:   Physical Exam  Constitutional: He is oriented to person, place, and time. He appears well-developed and well-nourished.  HENT:  Head: Normocephalic and atraumatic.  Right Ear: External ear normal.  Left Ear: External ear normal.  Nose: Nose normal.  Mouth/Throat: Oropharynx is clear and moist.  TMs and canals are clear.   Eyes: Pupils are equal, round, and reactive to light. Conjunctivae and EOM are normal.  Neck: Neck supple. No thyromegaly present.  Cardiovascular: Normal rate and normal heart sounds.  Pulmonary/Chest: Effort normal and breath sounds normal.  Lymphadenopathy:    He has no cervical adenopathy.  Neurological: He is alert and oriented to person, place, and time.  Skin: Skin is warm and dry.  Psychiatric: He has a normal mood and affect.          Assessment & Plan:  URI -influenza test was negative.  Likely viral.  Recommend symptomatic care.  Did write him a work note for yesterday today and tomorrow.  If he is continuing to have a fever then we can extend the note.  If not better after the weekend and please give us a call back.  Okay to continue symptomatic care.  Also encouraged him to schedule his follow-up appointment month for his 4516-month checkup.  Did refill his Lunesta until his appointment.

## 2017-10-09 NOTE — Patient Instructions (Signed)
Upper Respiratory Infection, Adult Most upper respiratory infections (URIs) are caused by a virus. A URI affects the nose, throat, and upper air passages. The most common type of URI is often called "the common cold." Follow these instructions at home:  Take medicines only as told by your doctor.  Gargle warm saltwater or take cough drops to comfort your throat as told by your doctor.  Use a warm mist humidifier or inhale steam from a shower to increase air moisture. This may make it easier to breathe.  Drink enough fluid to keep your pee (urine) clear or pale yellow.  Eat soups and other clear broths.  Have a healthy diet.  Rest as needed.  Go back to work when your fever is gone or your doctor says it is okay. ? You may need to stay home longer to avoid giving your URI to others. ? You can also wear a face mask and wash your hands often to prevent spread of the virus.  Use your inhaler more if you have asthma.  Do not use any tobacco products, including cigarettes, chewing tobacco, or electronic cigarettes. If you need help quitting, ask your doctor. Contact a doctor if:  You are getting worse, not better.  Your symptoms are not helped by medicine.  You have chills.  You are getting more short of breath.  You have brown or red mucus.  You have yellow or brown discharge from your nose.  You have pain in your face, especially when you bend forward.  You have a fever.  You have puffy (swollen) neck glands.  You have pain while swallowing.  You have white areas in the back of your throat. Get help right away if:  You have very bad or constant: ? Headache. ? Ear pain. ? Pain in your forehead, behind your eyes, and over your cheekbones (sinus pain). ? Chest pain.  You have long-lasting (chronic) lung disease and any of the following: ? Wheezing. ? Long-lasting cough. ? Coughing up blood. ? A change in your usual mucus.  You have a stiff neck.  You have  changes in your: ? Vision. ? Hearing. ? Thinking. ? Mood. This information is not intended to replace advice given to you by your health care provider. Make sure you discuss any questions you have with your health care provider. Document Released: 12/19/2007 Document Revised: 03/04/2016 Document Reviewed: 10/07/2013 Elsevier Interactive Patient Education  2018 Elsevier Inc.  

## 2017-10-10 ENCOUNTER — Telehealth: Payer: Self-pay

## 2017-10-10 ENCOUNTER — Other Ambulatory Visit: Payer: Self-pay | Admitting: Family Medicine

## 2017-10-10 MED ORDER — HYDROCODONE-HOMATROPINE 5-1.5 MG/5ML PO SYRP: 5.0000 mL | ORAL_SOLUTION | Freq: Every evening | ORAL | 0 refills | Status: DC | PRN

## 2017-10-10 NOTE — Telephone Encounter (Signed)
Rx sent for cough medication. 

## 2017-10-10 NOTE — Telephone Encounter (Signed)
[name] called and reports his cough is worse and he still has a fever. He would like cough medication so he is able to sleep. He also needs work note. Work note printed and in [employer].

## 2017-10-11 NOTE — Telephone Encounter (Signed)
Left message advising of cough medication.  

## 2017-10-11 NOTE — Telephone Encounter (Signed)
Letter signed and placed up front. Left VM advising Pt to pick up. If he wants it faxed, he will need to provide fax number. Callback information provided.

## 2017-10-14 ENCOUNTER — Other Ambulatory Visit: Payer: Self-pay | Admitting: Family Medicine

## 2017-10-14 DIAGNOSIS — G4700 Insomnia, unspecified: Secondary | ICD-10-CM

## 2017-10-18 NOTE — Telephone Encounter (Signed)
Received fax from [facility] that Alfonso PattenLunesta was approved from 10/18/2017 through 10/18/2020. Pharmacy notified and forms sent to scan.   Reference ID: [id number].

## 2017-11-11 ENCOUNTER — Encounter: Payer: Self-pay | Admitting: Family Medicine

## 2017-11-11 DIAGNOSIS — Z0189 Encounter for other specified special examinations: Secondary | ICD-10-CM

## 2017-11-29 ENCOUNTER — Other Ambulatory Visit: Payer: Self-pay | Admitting: Family Medicine

## 2017-12-10 ENCOUNTER — Other Ambulatory Visit: Payer: Self-pay | Admitting: Family Medicine

## 2017-12-10 ENCOUNTER — Other Ambulatory Visit: Payer: Self-pay

## 2017-12-10 DIAGNOSIS — G4700 Insomnia, unspecified: Secondary | ICD-10-CM

## 2017-12-10 MED ORDER — ESZOPICLONE 2 MG PO TABS: ORAL_TABLET | ORAL | 2 refills | Status: DC

## 2017-12-10 NOTE — Telephone Encounter (Signed)
Casey Fletcher requests a refill on Lunesta.

## 2017-12-16 ENCOUNTER — Emergency Department (HOSPITAL_COMMUNITY)
Admission: EM | Admit: 2017-12-16 | Discharge: 2017-12-16 | Disposition: A | Discharge status: Home / Self Care | Payer: PRIVATE HEALTH INSURANCE | Attending: Emergency Medicine | Admitting: Emergency Medicine

## 2017-12-16 ENCOUNTER — Other Ambulatory Visit: Payer: Self-pay

## 2017-12-16 ENCOUNTER — Emergency Department (HOSPITAL_COMMUNITY): Payer: PRIVATE HEALTH INSURANCE

## 2017-12-16 DIAGNOSIS — Z79899 Other long term (current) drug therapy: Secondary | ICD-10-CM | POA: Insufficient documentation

## 2017-12-16 DIAGNOSIS — E119 Type 2 diabetes mellitus without complications: Secondary | ICD-10-CM | POA: Diagnosis not present

## 2017-12-16 DIAGNOSIS — M25562 Pain in left knee: Secondary | ICD-10-CM | POA: Diagnosis not present

## 2017-12-16 DIAGNOSIS — E039 Hypothyroidism, unspecified: Secondary | ICD-10-CM | POA: Diagnosis not present

## 2017-12-16 DIAGNOSIS — I10 Essential (primary) hypertension: Secondary | ICD-10-CM | POA: Diagnosis not present

## 2017-12-16 MED ORDER — DICLOFENAC SODIUM 1 % TD GEL: 2.0000 g | Freq: Four times a day (QID) | TRANSDERMAL | 0 refills | Status: DC

## 2017-12-16 MED ORDER — ACETAMINOPHEN 325 MG PO TABS
650.0000 mg | ORAL_TABLET | Freq: Once | ORAL | Status: AC
  Administered 2017-12-16: 650 mg via ORAL
  Filled 2017-12-16: qty 2

## 2017-12-16 NOTE — ED Triage Notes (Signed)
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 States that his knee is burning.

## 2017-12-16 NOTE — ED Provider Notes (Signed)
Patient placed in Quick Look pathway, seen and evaluated   Chief Complaint: Knee pain  HPI:   Patient presenting with acute onset of left knee pain.  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  He states he kneeled down on his left knee rather hard in order to restrain patients and had immediate pain.  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  ROS: + Knee pain  Physical Exam:   Gen: No distress  Neuro: Awake and Alert  Skin: Warm    Focused Exam: Left anterior knee with large varicose vein.  Mild edema and tenderness over anterior aspect.  Normal range of motion though w some pain.   Initiation of care has begun. The patient has been counseled on the process, plan, and necessity for staying for the completion/evaluation, and the remainder of the medical screening examination    Robinson, [place]Jordan N, PA-C 12/16/17 1918    Little, Ambrose Finlandachel Morgan, MD 12/18/17 1440

## 2017-12-16 NOTE — Discharge Instructions (Signed)
Use Tylenol as needed for pain. Use Voltaren gel as needed for pain and swelling. Apply ice to your knee, 20 minutes at a time, 3-4 times a day. You will probably have continued soreness and swelling as you on your feet for the next several days. Follow up with your primary care doctor you have no improvement of her symptoms of the course of the next week. Return to the emergency room if you develop significant worsening of swelling, numbness of your leg, inability to walk, or any new or concerning symptoms.

## 2017-12-17 NOTE — ED Provider Notes (Signed)
[facility] EMERGENCY DEPARTMENT Provider Note   CSN: [id number] Arrival date & time: 12/16/17  1901     History   Chief Complaint Chief Complaint  Patient presents with  . Knee Pain    HPI Casey Fletcher is a 42 y.o. male presenting for evaluation of left knee pain.  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  Since then, he has been having irritation of the anterior knee, described as a burning.  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  He denies numbness or tingling.  He denies pain elsewhere.  There is no radiation of the pain.  Nothing makes it better or worse.  He has not had anything for pain.  He denies injury of the knee in the past.  He cannot take NSAIDs due to her previous gastric bypass.  He denies injury elsewhere.  He is not on blood thinners.  HPI  Past Medical History:  Diagnosis Date  . Abdominal pain   . Anemia   . Asthma   . Blood in urine   . Blood transfusion   . Chills   . Cough   . Depression   . Diabetes mellitus   . Fainting   . Fatigue   . GERD (gastroesophageal reflux disease)   . Hypertension   . Hypothyroidism   . Jaundice   . Obesity   . Renal insufficiency   . Shortness of breath   . Vitamin deficiency   . Weakness generalized     Patient Active Problem List   Diagnosis Date Noted  . Glaucoma suspect of both eyes 09/19/2017  . Chalazion left upper eyelid 09/19/2017  . Plantar fasciitis, right 01/25/2017  . Left testicular pain 07/26/2016  . Insomnia 01/03/2016  . Acute depression 01/03/2016  . Left-sided weakness 04/19/2014  . Bell's palsy 04/17/2014  . Iron deficiency anemia 05/14/2011  . History of gallstones 05/14/2011  . Vitamin B12 deficiency 04/11/2011  . Fatigue 11/06/2010  . UNSPECIFIED ANEMIA 08/02/2010  . BARIATRIC SURGERY STATUS 08/02/2010  . THYROID  NODULE, RIGHT 11/30/2009  . UNSPECIFIED HYPOTHYROIDISM 11/28/2009  . OBESITY, UNSPECIFIED 11/28/2009  . DEPRESSION 11/28/2009  . FATTY LIVER DISEASE 05/18/2008  . HYPERTENSION, BENIGN ESSENTIAL 04/03/2007    Past Surgical History:  Procedure Laterality Date  . APPENDECTOMY  1988  . CARDIAC CATHETERIZATION  2005  . CHOLECYSTECTOMY  05/08/11  . ESOPHAGOGASTRODUODENOSCOPY  08/07/2011   Procedure: ESOPHAGOGASTRODUODENOSCOPY (EGD);  Surgeon: Kandis Cocking, MD;  Location: [facility];  Service: Endoscopy;  Laterality: N/A;  . 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 Authorizing Provider  amLODipine (NORVASC) 5 MG tablet Take 1 tablet (5 mg total) by mouth daily. Must schedule OV for more refills 12/10/17   Agapito Games, MD  buPROPion (WELLBUTRIN XL) 300 MG 24 hr tablet Take 1 tablet (300 mg total) by mouth every morning. Must schedule OV for more refills 12/10/17   Agapito Games, MD  diclofenac sodium (VOLTAREN) 1 % GEL Apply 2 g topically 4 (four) times daily. 12/16/17   Talayeh Bruinsma, PA-C  eszopiclone (LUNESTA) 2 MG TABS tablet Take immediately before bedtime 12/10/17   Agapito Games, MD  HYDROcodone-homatropine (  HYCODAN) 5-1.5 MG/5ML syrup Take 5 mLs by mouth at bedtime as needed for cough. 10/10/17   Agapito GamesMetheney, Catherine D, MD    Family History Family History  Problem Relation Age of Onset  . Hyperlipidemia Mother   . Hypertension Mother   . Hyperlipidemia Father   . Hypertension Father     Social History Social History   Tobacco Use  . Smoking status: Never Smoker  . Smokeless tobacco: Never Used  Substance Use Topics  . Alcohol use: No  . Drug use: No     Allergies   Haemophilus influenzae and Influenza vac split [flu virus vaccine]   Review of Systems Review of Systems  Musculoskeletal: Positive for arthralgias and joint  swelling.  Neurological: Negative for numbness.     Physical Exam Updated Vital Signs BP (!) 135/102 (BP Location: Right Arm)   Pulse 88   Temp 98.1 F (36.7 C) (Oral)   Resp 18   Ht 6\' 3"  (1.905 m)   Wt 135.6 kg (299 lb)   SpO2 98%   BMI 37.37 kg/m   Physical Exam  Constitutional: He is oriented to person, place, and time. He appears well-developed and well-nourished. No distress.  HENT:  Head: Normocephalic and atraumatic.  Eyes: EOM are normal.  Neck: Normal range of motion.  Pulmonary/Chest: Effort normal.  Abdominal: He exhibits no distension.  Musculoskeletal: Normal range of motion. He exhibits edema. He exhibits no tenderness or deformity.  Minimal swelling of the left anterior knee, mostly at the site of a varicose vein.  No hematoma or contusion.  Sensation of bilateral extremities intact.  Pedal pulses intact bilaterally.  Soft compartments.  No tenderness to palpation along the joint line.  No pain with varus or valgus stress.  Strength bilateral extremities intact.  Patient is ambulatory.  Neurological: He is alert and oriented to person, place, and time. No sensory deficit.  Skin: Skin is warm. No rash noted.  Psychiatric: He has a normal mood and affect.  Nursing note and vitals reviewed.    ED Treatments / Results  Labs (all labs ordered are listed, but only abnormal results are displayed) Labs Reviewed - No data to display  EKG None  Radiology Dg Knee Complete 4 Views Left  Result Date: 12/16/2017 CLINICAL DATA:  Left knee pain after injury. EXAM: LEFT KNEE - COMPLETE 4+ VIEW COMPARISON:  None. FINDINGS: No evidence of fracture, dislocation, or joint effusion. No evidence of arthropathy or other focal bone abnormality. Soft tissues are unremarkable. IMPRESSION: Negative radiographs of the left knee. Electronically Signed   By: Rubye OaksMelanie  Ehinger M.D.   On: 12/16/2017 21:29    Procedures Procedures (including critical care time)  Medications Ordered in  ED Medications  acetaminophen (TYLENOL) tablet 650 mg (650 mg Oral Given 12/16/17 1915)     Initial Impression / Assessment and Plan / ED Course  I have reviewed the triage vital signs and the nursing notes.  Pertinent labs & imaging results that were available during my care of the patient were reviewed by me and considered in my medical decision making (see chart for details).     Patient presenting for evaluation of left knee injury.  Physical exam reassuring, he is neurovascularly intact.  Mild swelling at the site of varicose vein.  X-ray reviewed and interpreted by me, no fracture dislocation.  Discussed with patient.  Discussed treatment with Voltaren gel for pain and swelling.  Tylenol as needed for pain.  Discussed ice and rest.  Follow-up  with primary care as needed.  At this time, patient appears safe for discharge.  Return precautions given.  Patient states he understands and agrees to plan.  Final Clinical Impressions(s) / ED Diagnoses   Final diagnoses:  Acute pain of left knee    ED Discharge Orders        Ordered    diclofenac sodium (VOLTAREN) 1 % GEL  4 times daily     12/16/17 2142       Chaia Ikard, PA-C 12/17/17 0034    Mancel Bale, MD 12/24/17 [phone]

## 2017-12-19 ENCOUNTER — Encounter: Payer: Self-pay | Admitting: Physician Assistant

## 2017-12-19 ENCOUNTER — Ambulatory Visit (INDEPENDENT_AMBULATORY_CARE_PROVIDER_SITE_OTHER): Payer: PRIVATE HEALTH INSURANCE | Admitting: Physician Assistant

## 2017-12-19 VITALS — BP 129/88 | HR 80 | Wt 318.0 lb

## 2017-12-19 DIAGNOSIS — I83812 Varicose veins of left lower extremities with pain: Secondary | ICD-10-CM

## 2017-12-19 DIAGNOSIS — H0011 Chalazion right upper eyelid: Secondary | ICD-10-CM | POA: Diagnosis not present

## 2017-12-19 DIAGNOSIS — M25562 Pain in left knee: Secondary | ICD-10-CM | POA: Diagnosis not present

## 2017-12-19 MED ORDER — DICLOFENAC SODIUM 75 MG PO TBEC: 75.0000 mg | DELAYED_RELEASE_TABLET | Freq: Two times a day (BID) | ORAL | 0 refills | Status: DC

## 2017-12-19 MED ORDER — MEDICAL COMPRESSION THIGH HIGH MISC
0 refills | Status: DC
Start: 2017-12-19 — End: 2017-12-30

## 2017-12-19 NOTE — Progress Notes (Signed)
HPI:                                                                Casey Fletcher is a 42 y.o. male who presents to [facility]: Primary Care Sports Medicine today for knee swelling/pain  Work-related incident on June 3rd. 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 Reports immediate pain and swelling and noted that his varicose vein had increased in size. Describes tenderness and discomfort of the vein and intermittent redness.  He was seen in the ED on 6/3, had negative knee x-rays He has been taking oral Diclofenac 75 mg bid, elevating and icing.     No flowsheet data found.    Past Medical History:  Diagnosis Date  . Abdominal pain   . Anemia   . Asthma   . Blood in urine   . Blood transfusion   . Chills   . Cough   . Depression   . Diabetes mellitus   . Fainting   . Fatigue   . GERD (gastroesophageal reflux disease)   . Hypertension   . Hypothyroidism   . Jaundice   . Obesity   . Renal insufficiency   . Shortness of breath   . Vitamin deficiency   . Weakness generalized    Past Surgical History:  Procedure Laterality Date  . APPENDECTOMY  1988  . CARDIAC CATHETERIZATION  2005  . CHOLECYSTECTOMY  05/08/11  . ESOPHAGOGASTRODUODENOSCOPY  08/07/2011   Procedure: ESOPHAGOGASTRODUODENOSCOPY (EGD);  Surgeon: Kandis Cocking, MD;  Location: [facility];  Service: Endoscopy;  Laterality: N/A;  . 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 Smoking status: Never Smoker  . Smokeless tobacco: Never Used  Substance Use Topics  . Alcohol use: No   family history includes Hyperlipidemia in his father and mother; Hypertension in his father and mother.    ROS: negative except as noted in the HPI  Medications: Current Outpatient Medications  Medication Sig Dispense Refill  . amLODipine (NORVASC) 5 MG tablet Take 1 tablet (5 mg total) by  mouth daily. Must schedule OV for more refills 30 tablet 0  . buPROPion (WELLBUTRIN XL) 300 MG 24 hr tablet Take 1 tablet (300 mg total) by mouth every morning. Must schedule OV for more refills 30 tablet 0  . diclofenac sodium (VOLTAREN) 1 % GEL Apply 2 g topically 4 (four) times daily. 100 g 0  . eszopiclone (LUNESTA) 2 MG TABS tablet Take immediately before bedtime 30 tablet 2  . HYDROcodone-homatropine (HYCODAN) 5-1.5 MG/5ML syrup Take 5 mLs by mouth at bedtime as needed for cough. 120 mL 0   No current facility-administered medications for this visit.    Allergies  Allergen Reactions  . Haemophilus Influenzae     Other reaction(s): Other (See Comments) Bell's Palsy  . Influenza Vac Split [Flu Virus Vaccine] Other (See Comments)    Bell's Palsy       Objective:  BP 129/88   Pulse 80   Wt (!) 318 lb (144.2 kg)   BMI 39.75 kg/m  Gen:  alert, not ill-appearing, no distress, appropriate for age HEENT: head normocephalic without obvious abnormality,  conjunctiva and cornea clear, trachea midline Pulm: Normal work of breathing, normal phonation, clear to auscultation bilaterally, no wheezes, rales or rhonchi CV: Normal rate, regular rhythm, s1 and s2 distinct, no murmurs, clicks or rubs  Neuro: alert and oriented x 3, no tremor MSK: left knee with mild edema, ROM intact, no erythema or warmth, large varicosity overlying the anterior knee extending from the medial thigh in the area of the great saphenous vein; no peripheral edema, antalgic gait Skin: intact, no rashes on exposed skin, no jaundice, no cyanosis Psych: well-groomed, cooperative, good eye contact, euthymic mood, affect mood-congruent, speech is articulate, and thought processes clear and goal-directed    No results found for this or any previous visit (from the past 72 hour(s)). Dg Knee Complete 4 Views Left  Result Date: 12/16/2017 CLINICAL DATA:  Left knee pain after injury. EXAM: LEFT KNEE - COMPLETE 4+ VIEW  COMPARISON:  None. FINDINGS: No evidence of fracture, dislocation, or joint effusion. No evidence of arthropathy or other focal bone abnormality. Soft tissues are unremarkable. IMPRESSION: Negative radiographs of the left knee. Electronically Signed   By: Rubye OaksMelanie  Ehinger M.D.   On: 12/16/2017 21:29      Assessment and Plan: 42 y.o. male with   Acute pain of left knee  Varicose veins of left lower extremity with pain - Plan: diclofenac (VOLTAREN) 75 MG EC tablet, Elastic Bandages & Supports (MEDICAL COMPRESSION THIGH HIGH) MISC, US Venous Img Lower Unilateral Left, CANCELED: US Venous Img Lower Unilateral Left  Chalazion of right upper eyelid - Plan: Ambulatory referral to Plastic Surgery  - ordering US to r/o thrombophlebitis at the saphenofemoral junction - continue Diclofenac 75 mg bid and RICE protocol - work note provided until Tuesday  - chalazion recurrence of the right upper eyelid was also noted and patient was referred to plastic surgery   Patient education and anticipatory guidance given Patient agrees with treatment plan Follow-up as needed if symptoms worsen or fail to improve  Levonne Hubertharley E. Cummings PA-C

## 2017-12-19 NOTE — Patient Instructions (Signed)
Varicose Veins Varicose veins are veins that have become enlarged and twisted. They are usually seen in the legs but can occur in other parts of the body as well. What are the causes? This condition is the result of valves in the veins not working properly. Valves in the veins help to return blood from the leg to the heart. If these valves are damaged, blood flows backward and backs up into the veins in the leg near the skin. This causes the veins to become larger. What increases the risk? People who are on their feet a lot, who are pregnant, or who are overweight are more likely to develop varicose veins. What are the signs or symptoms?  Bulging, twisted-appearing, bluish veins, most commonly found on the legs.  Leg pain or a feeling of heaviness. These symptoms may be worse at the end of the day.  Leg swelling.  Changes in skin color. How is this diagnosed? A health care provider can usually diagnose varicose veins by examining your legs. Your health care provider may also recommend an ultrasound of your leg veins. How is this treated? Most varicose veins can be treated at home.However, other treatments are available for people who have persistent symptoms or want to improve the cosmetic appearance of the varicose veins. These treatment options include:  Sclerotherapy. A solution is injected into the vein to close it off.  Laser treatment. A laser is used to heat the vein to close it off.  Radiofrequency vein ablation. An electrical current produced by radio waves is used to close off the vein.  Phlebectomy. The vein is surgically removed through small incisions made over the varicose vein.  Vein ligation and stripping. The vein is surgically removed through incisions made over the varicose vein after the vein has been tied (ligated). Follow these instructions at home:   Do not stand or sit in one position for long periods of time. Do not sit with your legs crossed. Rest with your  legs raised during the day.  Wear compression stockings as directed by your health care provider. These stockings help to prevent blood clots and reduce swelling in your legs.  Do not wear other tight, encircling garments around your legs, pelvis, or waist.  Walk as much as possible to increase blood flow.  Raise the foot of your bed at night with 2-inch blocks.  If you get a cut in the skin over the vein and the vein bleeds, lie down with your leg raised and press on it with a clean cloth until the bleeding stops. Then place a bandage (dressing) on the cut. See your health care provider if it continues to bleed. Contact a health care provider if:  The skin around your ankle starts to break down.  You have pain, redness, tenderness, or hard swelling in your leg over a vein.  You are uncomfortable because of leg pain. This information is not intended to replace advice given to you by your health care provider. Make sure you discuss any questions you have with your health care provider. Document Released: 04/11/2005 Document Revised: 12/08/2015 Document Reviewed: 01/03/2016 Elsevier Interactive Patient Education  2017 Elsevier Inc.  

## 2017-12-20 ENCOUNTER — Ambulatory Visit (HOSPITAL_BASED_OUTPATIENT_CLINIC_OR_DEPARTMENT_OTHER): Admission: RE | Admit: 2017-12-20 | Payer: BC Managed Care – PPO | Source: Ambulatory Visit

## 2017-12-30 ENCOUNTER — Ambulatory Visit (INDEPENDENT_AMBULATORY_CARE_PROVIDER_SITE_OTHER): Payer: BC Managed Care – PPO | Admitting: Family Medicine

## 2017-12-30 ENCOUNTER — Encounter: Payer: Self-pay | Admitting: Family Medicine

## 2017-12-30 VITALS — BP 128/78 | HR 77 | Ht 74.0 in | Wt 312.0 lb

## 2017-12-30 DIAGNOSIS — G4700 Insomnia, unspecified: Secondary | ICD-10-CM | POA: Diagnosis not present

## 2017-12-30 DIAGNOSIS — R7309 Other abnormal glucose: Secondary | ICD-10-CM

## 2017-12-30 DIAGNOSIS — I10 Essential (primary) hypertension: Secondary | ICD-10-CM

## 2017-12-30 DIAGNOSIS — F329 Major depressive disorder, single episode, unspecified: Secondary | ICD-10-CM

## 2017-12-30 DIAGNOSIS — F32A Depression, unspecified: Secondary | ICD-10-CM

## 2017-12-30 MED ORDER — AMLODIPINE BESYLATE 5 MG PO TABS: 5.0000 mg | ORAL_TABLET | Freq: Every day | ORAL | 1 refills | Status: DC

## 2017-12-30 MED ORDER — ESCITALOPRAM OXALATE 10 MG PO TABS: 10.0000 mg | ORAL_TABLET | Freq: Every day | ORAL | 0 refills | Status: DC

## 2017-12-30 MED ORDER — BUPROPION HCL ER (XL) 150 MG PO TB24: 150.0000 mg | ORAL_TABLET | Freq: Every morning | ORAL | 0 refills | Status: DC

## 2017-12-30 NOTE — Progress Notes (Signed)
Subjective:    CC: BP, Mood  HPI:  Hypertension- Pt denies chest pain, dizziness, or heart palpitations.  He does get some occ SOB.  Taking meds as directed w/o problems.  Denies medication side effects.    F/U insomnia -he takes his Lunesta nightly and it does work well.  He denies any significant side effects with the medication.  F/U acute depression. He feels the wellbutrin isn't working well.  He still just feels like his mind constantly races.  He had a hard time controlling his worry and thoughts.  Still feeling down at times.  Past medical history, Surgical history, Family history not pertinant except as noted below, Social history, Allergies, and medications have been entered into the medical record, reviewed, and corrections made.   Review of Systems: No fevers, chills, night sweats, weight loss, chest pain, or shortness of breath.   Objective:    General: Well Developed, well nourished, and in no acute distress.  Neuro: Alert and oriented x3, extra-ocular muscles intact, sensation grossly intact.  HEENT: Normocephalic, atraumatic  Skin: Warm and dry, no rashes. Cardiac: Regular rate and rhythm, no murmurs rubs or gallops, no lower extremity edema.  Respiratory: Clear to auscultation bilaterally. Not using accessory muscles, speaking in full sentences.   Impression and Recommendations:    HTN  - Well controlled. Continue current regimen. Follow up in  4 - 6 months.   Insomnia - Good on Lunesta until July.    Acute depression -not well controlled.  PHQ 9 score of 7 and gad 7 score of 11.  Discussed options.  We will go ahead and decrease Wellbutrin 150 for the next 2 weeks and then he can stop the medication.  Also start Lexapro.  Encouraged him to take in the morning and he can take at the same time as the Wellbutrin.  Abnormal glucose - due for A1C.

## 2017-12-31 ENCOUNTER — Encounter: Payer: Self-pay | Admitting: Family Medicine

## 2017-12-31 DIAGNOSIS — E785 Hyperlipidemia, unspecified: Secondary | ICD-10-CM | POA: Insufficient documentation

## 2018-01-01 ENCOUNTER — Ambulatory Visit (INDEPENDENT_AMBULATORY_CARE_PROVIDER_SITE_OTHER): Payer: BC Managed Care – PPO | Admitting: Sports Medicine

## 2018-01-01 ENCOUNTER — Encounter: Payer: Self-pay | Admitting: Sports Medicine

## 2018-01-01 DIAGNOSIS — M722 Plantar fascial fibromatosis: Secondary | ICD-10-CM

## 2018-01-01 NOTE — Assessment & Plan Note (Signed)
Custom orthotics, as well as a plantar fascia injection today, previous injection was about 6 months ago.

## 2018-01-01 NOTE — Progress Notes (Signed)
    Patient was fitted for a : standard, cushioned, semi-rigid orthotic. The orthotic was heated and afterward the patient stood on the orthotic blank positioned on the orthotic stand. The patient was positioned in subtalar neutral position and 10 degrees of ankle dorsiflexion in a weight bearing stance. After completion of molding, a stable base was applied to the orthotic blank. The blank was ground to a stable position for weight bearing. Size: 16 Base: [facility] Additional Posting and Padding: None The patient ambulated these, and they were very comfortable.  Procedure: Real-time Ultrasound Guided Injection of right plantar fascia origin Device: GE Logiq E  Verbal informed consent obtained.  Time-out conducted.  Noted no overlying erythema, induration, or other signs of local infection.  Skin prepped in a sterile fashion.  Local anesthesia: Topical Ethyl chloride.  With sterile technique and under real time ultrasound guidance: 1 cc Kenalog 40, 1 cc lidocaine, 1 cc bupivacaine injected easily at the plantar fascia origin Completed without difficulty  Pain immediately resolved suggesting accurate placement of the medication.  Advised to call if fevers/chills, erythema, induration, drainage, or persistent bleeding.  Images permanently stored and available for review in the ultrasound unit.  Impression: Technically successful ultrasound guided injection.  I spent 40 minutes with this patient, greater than 50% was face-to-face time counseling regarding the below diagnosis, this was separate from the time spent performing the procedure above.  ___________________________________________ Ihor Austinhomas J. Benjamin Stainhekkekandam, M.D., ABFM., CAQSM. Primary Care and Sports Medicine [facility]  Adjunct Instructor of Family Medicine  [employer]

## 2018-01-02 ENCOUNTER — Encounter: Payer: Self-pay | Admitting: Family Medicine

## 2018-01-09 ENCOUNTER — Other Ambulatory Visit: Payer: Self-pay

## 2018-01-09 DIAGNOSIS — I83892 Varicose veins of left lower extremities with other complications: Secondary | ICD-10-CM

## 2018-01-23 ENCOUNTER — Ambulatory Visit: Payer: BC Managed Care – PPO | Admitting: Sports Medicine

## 2018-02-10 ENCOUNTER — Encounter

## 2018-02-10 ENCOUNTER — Encounter: Payer: BC Managed Care – PPO | Admitting: Vascular Surgery

## 2018-02-10 ENCOUNTER — Encounter (HOSPITAL_COMMUNITY): Payer: BC Managed Care – PPO

## 2018-02-11 ENCOUNTER — Encounter

## 2018-02-13 ENCOUNTER — Ambulatory Visit (INDEPENDENT_AMBULATORY_CARE_PROVIDER_SITE_OTHER): Payer: PRIVATE HEALTH INSURANCE | Admitting: Vascular Surgery

## 2018-02-13 ENCOUNTER — Encounter: Payer: Self-pay | Admitting: Vascular Surgery

## 2018-02-13 ENCOUNTER — Other Ambulatory Visit: Payer: Self-pay

## 2018-02-13 ENCOUNTER — Ambulatory Visit (HOSPITAL_COMMUNITY)
Admission: RE | Admit: 2018-02-13 | Discharge: 2018-02-13 | Disposition: A | Discharge status: Home / Self Care | Payer: PRIVATE HEALTH INSURANCE | Source: Ambulatory Visit | Attending: Vascular Surgery | Admitting: Vascular Surgery

## 2018-02-13 VITALS — BP 111/76 | HR 60 | Temp 97.7°F | Resp 18 | Ht 76.0 in | Wt 309.0 lb

## 2018-02-13 DIAGNOSIS — I83892 Varicose veins of left lower extremities with other complications: Secondary | ICD-10-CM | POA: Insufficient documentation

## 2018-02-13 DIAGNOSIS — I83812 Varicose veins of left lower extremities with pain: Secondary | ICD-10-CM | POA: Diagnosis not present

## 2018-02-13 DIAGNOSIS — I868 Varicose veins of other specified sites: Secondary | ICD-10-CM

## 2018-02-13 NOTE — Progress Notes (Signed)
Referring Physician: Dr Lucia GaskinsMary Hunt Patient name: Casey BeckerJavier Fletcher MRN: [id number] DOB: July 02, 1976 Sex: male  REASON FOR CONSULT: Varicose veins left leg with pain  HPI: Casey BeckerJavier Fletcher is a 42 y.o. male, sent for evaluation of varicose veins with pain in his left leg.  The patient has had chronic varicose veins in his left leg for a lengthy period of time.  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  He fell and hit his left knee during the time of the incident.  He states that his varicose veins became worse after this and he has had more pain in them.  He has pain primarily around the knee and around the area of the varicosity.  He has never worn compression stockings in the past.  He denies prior history of DVT.  He does have family history of varicose veins in his mother.  The incident happened in late June of this year.  The patient cannot take nonsteroidal anti-inflammatories due to a previous gastric bypass.  He states that he did have varicosities in both legs prior to his gastric bypass but after losing a significant amount of weight this improved somewhat.  He states that working flares up the pain in his knee sometimes.  He has missed work secondary to this.  Other medical problems include asthma, diabetes, hypertension all of which are currently stable.  Past Medical History:  Diagnosis Date  . Abdominal pain   . Anemia   . Asthma   . Blood in urine   . Blood transfusion   . Chills   . Cough   . Depression   . Diabetes mellitus   . Fainting   . Fatigue   . GERD (gastroesophageal reflux disease)   . Hypertension   . Hypothyroidism   . Jaundice   . Obesity   . Renal insufficiency   . Shortness of breath   . Vitamin deficiency   . Weakness generalized    Past Surgical History:  Procedure Laterality Date  . APPENDECTOMY  1988  . CARDIAC CATHETERIZATION  2005  . CHOLECYSTECTOMY  05/08/11    . ESOPHAGOGASTRODUODENOSCOPY  08/07/2011   Procedure: ESOPHAGOGASTRODUODENOSCOPY (EGD);  Surgeon: Kandis Cockingavid H Newman, MD;  [facility];  Service: Endoscopy;  Laterality: N/A;  . 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 Hyperlipidemia Mother   . Hypertension Mother   . Hyperlipidemia Father   . Hypertension Father     SOCIAL HISTORY: Social History   Socioeconomic History  . Marital status: Married    Spouse name: Not on file  . Number of children: Not on file  . Years of education: Not on file  . Highest education level: Not on file  Occupational History  . Not on file  Social Needs  . Financial resource strain: Not on file  . Food insecurity:    Worry: Not on file    Inability: Not on file  . Transportation needs:    Medical: Not on file    Non-medical: Not on file  Tobacco Use  . Smoking status: Never Smoker  . Smokeless tobacco: Never Used  Substance and Sexual Activity  . Alcohol use: No  . Drug use: No  . Sexual activity: Not on file  Lifestyle  . Physical activity:    Days per week: Not on file  Minutes per session: Not on file  . Stress: Not on file  Relationships  . Social connections:    Talks on phone: Not on file    Gets together: Not on file    Attends religious service: Not on file    Active member of club or organization: Not on file    Attends meetings of clubs or organizations: Not on file    Relationship status: Not on file  . Intimate partner violence:    Fear of current or ex partner: Not on file    Emotionally abused: Not on file    Physically abused: Not on file    Forced sexual activity: Not on file  Other Topics Concern  . Not on file  Social History Narrative  . Not on file    Allergies  Allergen Reactions  . Haemophilus Influenzae     Other reaction(s): Other (See Comments) Bell's Palsy  . Influenza Vac Split [Flu Virus Vaccine] Other (See Comments)     Bell's Palsy    Current Outpatient Medications  Medication Sig Dispense Refill  . amLODipine (NORVASC) 5 MG tablet Take 1 tablet (5 mg total) by mouth daily. Must schedule OV for more refills 90 tablet 1  . escitalopram (LEXAPRO) 10 MG tablet Take 1 tablet (10 mg total) by mouth daily. 90 tablet 0  . eszopiclone (LUNESTA) 2 MG TABS tablet Take immediately before bedtime 30 tablet 2  . buPROPion (WELLBUTRIN XL) 150 MG 24 hr tablet Take 1 tablet (150 mg total) by mouth every morning. Must schedule OV for more refills (Patient not taking: Reported on 02/13/2018) 15 tablet 0   No current facility-administered medications for this visit.     ROS:   General:  No weight loss, Fever, chills  HEENT: No recent headaches, no nasal bleeding, no visual changes, no sore throat  Neurologic: No dizziness, blackouts, seizures. No recent symptoms of stroke or mini- stroke. No recent episodes of slurred speech, or temporary blindness.  Cardiac: No recent episodes of chest pain/pressure, no shortness of breath at rest.  No shortness of breath with exertion.  Denies history of atrial fibrillation or irregular heartbeat  Vascular: No history of rest pain in feet.  No history of claudication.  No history of non-healing ulcer, No history of DVT   Pulmonary: No home oxygen, no productive cough, no hemoptysis,  + asthma or wheezing  Musculoskeletal:  [ ]  Arthritis, [ ]  Low back pain,  [X]  Joint pain  Hematologic:No history of hypercoagulable state.  No history of easy bleeding.  No history of anemia  Gastrointestinal: No hematochezia or melena,  No gastroesophageal reflux, no trouble swallowing  Urinary: [X]  chronic Kidney disease, [ ]  on HD - [ ]  MWF or [ ]  TTHS, [ ]  Burning with urination, [ ]  Frequent urination, [ ]  Difficulty urinating;   Skin: No rashes  Psychological: No history of anxiety,  No history of depression   Physical Examination  Vitals:   02/13/18 0922  BP: 111/76  Pulse: 60  Resp:  18  Temp: 97.7 F (36.5 C)  TempSrc: Oral  SpO2: 100%  Weight: (!) 309 lb (140.2 kg)  Height: 6\' 4"  (1.93 m)    Body mass index is 37.61 kg/m.  General:  Alert and oriented, no acute distress HEENT: Normal Neck: No bruit or JVD Pulmonary: Clear to auscultation bilaterally Cardiac: Regular Rate and Rhythm without murmur Abdomen: Soft, non-tender, non-distended, no mass, obese Skin: No rash, large varicosity as depicted in the  image below, some scattered reticular varicosities lateral aspect of thigh bilaterally Extremity Pulses:  2+ radial, brachial, femoral, dorsalis pedis, posterior tibial pulses bilaterally Musculoskeletal: No deformity is pretibial bilaterally edema  Neurologic: Upper and lower extremity motor 5/5 and symmetric  DATA:  She had a venous reflux exam today which showed large incompetent varicose vein across the left knee as depicted in the picture above.  Were also abnormal reflux time in the common femoral vein saphenofemoral junction and greater saphenous vein in the proximal thigh.  However vein diameter was only 2 to 3 mm in the greater saphenous.  ASSESSMENT: Varicose veins with pain left leg.  I am not sure whether or not his recent injury aggravated this but am doubtful that his varicosities enlarged secondary to the trauma.  I guess this is within the realm of possibilities.   PLAN: Discussed with patient today prescription for long leg compression stockings.  I also believe he needs orthopedic evaluation because most of his pain is centered on his knee which is not always characteristic of pain from varicose veins.  I did discuss with him that at some point we could consider stab avulsions of the varicosity across his left leg if this continues to cause him pain.  I do not believe that currently he is a candidate for laser ablation of the greater saphenous vein diameter is fairly small although it does have reflux within it.  Down the road this may enlarge with  time and we may need to consider laser ablation at some point in the future.  After his orthopedic evaluation he will call us back for a 65-month appointment for further evaluation and consideration for stab avulsions of the left leg varicosity if necessary.  From my standpoint he can return to work full duty.  He should wear his compression stockings while at work.  He should elevate his legs at the end of the day.  He can take extra strength Tylenol intermittently for pain if necessary.  Fabienne Bruns, MD [facility] Office: [phone] Pager: [phone]

## 2018-02-17 ENCOUNTER — Ambulatory Visit: Payer: BC Managed Care – PPO | Admitting: Family Medicine

## 2018-03-04 MED FILL — DICLOFENAC 1.5% TOPICAL SOL: 1.5 | 30 days supply | Qty: 300 | Fill #0

## 2018-03-25 ENCOUNTER — Other Ambulatory Visit: Payer: Self-pay | Admitting: Family Medicine

## 2018-04-01 ENCOUNTER — Ambulatory Visit (INDEPENDENT_AMBULATORY_CARE_PROVIDER_SITE_OTHER): Payer: BC Managed Care – PPO | Admitting: Family Medicine

## 2018-04-01 ENCOUNTER — Other Ambulatory Visit: Payer: Self-pay | Admitting: Family Medicine

## 2018-04-01 ENCOUNTER — Encounter: Payer: Self-pay | Admitting: Family Medicine

## 2018-04-01 VITALS — BP 128/86 | HR 79 | Temp 98.1°F | Ht 76.0 in | Wt 312.0 lb

## 2018-04-01 DIAGNOSIS — E538 Deficiency of other specified B group vitamins: Secondary | ICD-10-CM

## 2018-04-01 DIAGNOSIS — D509 Iron deficiency anemia, unspecified: Secondary | ICD-10-CM | POA: Diagnosis not present

## 2018-04-01 DIAGNOSIS — R509 Fever, unspecified: Secondary | ICD-10-CM

## 2018-04-01 LAB — CBC WITH DIFFERENTIAL/PLATELET
BASOS PCT: 0.4 %
Basophils Absolute: 23 cells/uL (ref 0–200)
EOS PCT: 2.3 %
Eosinophils Absolute: 131 cells/uL (ref 15–500)
HEMATOCRIT: 40.1 % (ref 38.5–50.0)
HEMOGLOBIN: 12.2 g/dL — AB (ref 13.2–17.1)
LYMPHS ABS: 1505 {cells}/uL (ref 850–3900)
MCH: 25.1 pg — ABNORMAL LOW (ref 27.0–33.0)
MCHC: 30.4 g/dL — ABNORMAL LOW (ref 32.0–36.0)
MCV: 82.3 fL (ref 80.0–100.0)
MPV: 11.7 fL (ref 7.5–12.5)
Monocytes Relative: 8.4 %
NEUTROS ABS: 3563 {cells}/uL (ref 1500–7800)
Neutrophils Relative %: 62.5 %
Platelets: 270 10*3/uL (ref 140–400)
RBC: 4.87 10*6/uL (ref 4.20–5.80)
RDW: 13.5 % (ref 11.0–15.0)
Total Lymphocyte: 26.4 %
WBC: 5.7 10*3/uL (ref 3.8–10.8)
WBCMIX: 479 {cells}/uL (ref 200–950)

## 2018-04-01 LAB — COMPLETE METABOLIC PANEL WITH GFR
AG Ratio: 1.7 (calc) (ref 1.0–2.5)
ALBUMIN MSPROF: 4.5 g/dL (ref 3.6–5.1)
ALKALINE PHOSPHATASE (APISO): 62 U/L (ref 40–115)
ALT: 17 U/L (ref 9–46)
AST: 19 U/L (ref 10–40)
BILIRUBIN TOTAL: 0.7 mg/dL (ref 0.2–1.2)
BUN: 19 mg/dL (ref 7–25)
CHLORIDE: 105 mmol/L (ref 98–110)
CO2: 26 mmol/L (ref 20–32)
CREATININE: 0.88 mg/dL (ref 0.60–1.35)
Calcium: 9.6 mg/dL (ref 8.6–10.3)
GFR, Est African American: 121 mL/min/{1.73_m2} (ref >=60)
GFR, Est Non African American: 104 mL/min/{1.73_m2} (ref >=60)
GLUCOSE: 92 mg/dL (ref 65–99)
Globulin: 2.7 g/dL (calc) (ref 1.9–3.7)
Potassium: 4.2 mmol/L (ref 3.5–5.3)
Sodium: 140 mmol/L (ref 135–146)
Total Protein: 7.2 g/dL (ref 6.1–8.1)

## 2018-04-01 NOTE — Progress Notes (Signed)
Casey Fletcher is a 42 y.o. male who presents to [facility]: Primary Care Sports Medicine today for fevers and chills and mild diarrhea.  Symptoms present for about 2 days.  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  He denies cough congestion runny nose abdominal pain nausea vomiting or diarrhea.  He is status post gastric bypass.  He feels pretty well otherwise.  No tick bites.  No trouble breathing.  No rash.  Family sick contacts.  Options are typically well controlled with ibuprofen.  Patient also thinks he is due for B12 checking.  He notes he had a history of B12 deficiency in the past and receives injections intermittently.   ROS as above:  Exam:  BP 128/86   Pulse 79   Temp 98.1 F (36.7 C) (Oral)   Ht 6\' 4"  (1.93 m)   Wt (!) 312 lb (141.5 kg)   BMI 37.98 kg/m  Wt Readings from Last 5 Encounters:  04/01/18 (!) 312 lb (141.5 kg)  02/13/18 (!) 309 lb (140.2 kg)  12/30/17 (!) 312 lb (141.5 kg)  12/19/17 (!) 318 lb (144.2 kg)  12/16/17 299 lb (135.6 kg)    Gen: Well NAD HEENT: EOMI,  MMM no significant nasal discharge.  Normal posterior pharynx and tympanic membranes.  No cervical lymphadenopathy Lungs: Normal work of breathing. CTABL Heart: RRR no MRG Abd: NABS, Soft. Nondistended, Nontender Exts: Brisk capillary refill, warm and well perfused.   Lab and Radiology Results No results found for this or any previous visit (from the past 72 hour(s)). No results found.    Assessment and Plan: 42 y.o. male with subjective fevers and chills.  Likely exposure to viral illness.  Plan for basic labs listed below and watchful waiting.  Work note provided recheck if not improving.  Continue over-the-counter medications.  B12 deficiency.  Plan to check B12.  History of gastric bypass likely scope repair.   Orders Placed  This Encounter  Procedures  . COMPLETE METABOLIC PANEL WITH GFR  . CBC with Differential/Platelet  . Vitamin B12   No orders of the defined types were placed in this encounter.    Historical information moved to improve visibility of documentation.  Past Medical History:  Diagnosis Date  . Abdominal pain   . Anemia   . Asthma   . Blood in urine   . Blood transfusion   . Chills   . Cough   . Depression   . Diabetes mellitus   . Fainting   . Fatigue   . GERD (gastroesophageal reflux disease)   . Hypertension   . Hypothyroidism   . Jaundice   . Obesity   . Renal insufficiency   . Shortness of breath   . Vitamin deficiency   . Weakness generalized    Past Surgical History:  Procedure Laterality Date  . APPENDECTOMY  1988  . CARDIAC CATHETERIZATION  2005  . CHOLECYSTECTOMY  05/08/11  . ESOPHAGOGASTRODUODENOSCOPY  08/07/2011   Procedure: ESOPHAGOGASTRODUODENOSCOPY (EGD);  Surgeon: Kandis Cocking, MD;  Location: [facility];  Service: Endoscopy;  Laterality: N/A;  . 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 Smoking status: Never Smoker  . Smokeless tobacco: Never Used  Substance Use Topics  . Alcohol use: No   family history includes Hyperlipidemia in his father and mother; Hypertension in  his father and mother.  Medications: Current Outpatient Medications  Medication Sig Dispense Refill  . amLODipine (NORVASC) 5 MG tablet Take 1 tablet (5 mg total) by mouth daily. Must schedule OV for more refills 90 tablet 1  . buPROPion (WELLBUTRIN XL) 150 MG 24 hr tablet Take 1 tablet (150 mg total) by mouth every morning. Must schedule OV for more refills 15 tablet 0  . escitalopram (LEXAPRO) 10 MG tablet Take 1 tablet (10 mg total) by mouth daily. LAST REFILL.YOU MUST SCHEDULE AND KEEP APPOINTMENT FOR REFILLS 90 tablet 0  . eszopiclone (LUNESTA) 2 MG TABS tablet Take immediately before bedtime 30 tablet 2   No current  facility-administered medications for this visit.    Allergies  Allergen Reactions  . Haemophilus Influenzae     Other reaction(s): Other (See Comments) Bell's Palsy  . Influenza Vac Split [Flu Virus Vaccine] Other (See Comments)    Bell's Palsy     Discussed warning signs or symptoms. Please see discharge instructions. Patient expresses understanding.

## 2018-04-01 NOTE — Patient Instructions (Signed)
Thank you for coming in today. Get labs now.  Continue tylenol for fever, chills.  Recheck if not improving.  Call or go to the emergency room if you get worse, have trouble breathing, have chest pains, or palpitations.

## 2018-04-02 LAB — VITAMIN B12: Vitamin B-12: 178 pg/mL — ABNORMAL LOW (ref 200–1100)

## 2018-04-11 ENCOUNTER — Telehealth: Payer: Self-pay | Admitting: Family Medicine

## 2018-04-11 NOTE — Telephone Encounter (Signed)
Egg free vaccine in. Left VM for Pt to callback and schedule. Added to flu list too so we can place a reminder call.

## 2018-04-11 NOTE — Telephone Encounter (Signed)
-----   Message from [name], MD sent at 04/01/2018 11:08 AM EDT ----- Regarding: Egg free flu vaccine Order egg free flu vaccine for [place]. Please scheulde nurse visit for around 3 weeks if not scheduled.

## 2018-04-15 LAB — COMPLETE METABOLIC PANEL WITH GFR
AG Ratio: 1.5 (calc) (ref 1.0–2.5)
ALKALINE PHOSPHATASE (APISO): 68 U/L (ref 40–115)
ALT: 19 U/L (ref 9–46)
AST: 18 U/L (ref 10–40)
Albumin: 4.4 g/dL (ref 3.6–5.1)
BILIRUBIN TOTAL: 0.7 mg/dL (ref 0.2–1.2)
BUN: 15 mg/dL (ref 7–25)
CO2: 27 mmol/L (ref 20–32)
Calcium: 9.6 mg/dL (ref 8.6–10.3)
Chloride: 105 mmol/L (ref 98–110)
Creat: 0.98 mg/dL (ref 0.60–1.35)
GFR, Est African American: 108 mL/min/{1.73_m2} (ref >=60)
GFR, Est Non African American: 93 mL/min/{1.73_m2} (ref >=60)
GLUCOSE: 98 mg/dL (ref 65–99)
Globulin: 2.9 g/dL (calc) (ref 1.9–3.7)
Potassium: 4.2 mmol/L (ref 3.5–5.3)
Sodium: 140 mmol/L (ref 135–146)
Total Protein: 7.3 g/dL (ref 6.1–8.1)

## 2018-04-15 LAB — T4, FREE: Free T4: 1 ng/dL (ref 0.8–1.8)

## 2018-04-15 LAB — LIPID PANEL
Cholesterol: 216 mg/dL — ABNORMAL HIGH (ref <200)
HDL: 46 mg/dL (ref >40)
LDL Cholesterol (Calc): 146 mg/dL (calc) — ABNORMAL HIGH
NON-HDL CHOLESTEROL (CALC): 170 mg/dL — AB (ref <130)
Total CHOL/HDL Ratio: 4.7 (calc) (ref <5.0)
Triglycerides: 118 mg/dL (ref <150)

## 2018-04-15 LAB — HEMOGLOBIN A1C
HEMOGLOBIN A1C: 5.5 %{Hb} (ref <5.7)
Mean Plasma Glucose: 111 (calc)
eAG (mmol/L): 6.2 (calc)

## 2018-04-15 LAB — TSH: TSH: 6.22 m[IU]/L — AB (ref 0.40–4.50)

## 2018-04-21 ENCOUNTER — Other Ambulatory Visit: Payer: Self-pay | Admitting: Family Medicine

## 2018-04-21 DIAGNOSIS — G4700 Insomnia, unspecified: Secondary | ICD-10-CM

## 2018-05-02 ENCOUNTER — Encounter: Payer: Self-pay | Admitting: Family Medicine

## 2018-05-02 ENCOUNTER — Ambulatory Visit (INDEPENDENT_AMBULATORY_CARE_PROVIDER_SITE_OTHER): Payer: BC Managed Care – PPO | Admitting: Family Medicine

## 2018-05-02 VITALS — BP 128/89 | HR 70 | Temp 98.0°F | Wt 311.0 lb

## 2018-05-02 DIAGNOSIS — I10 Essential (primary) hypertension: Secondary | ICD-10-CM | POA: Diagnosis not present

## 2018-05-02 DIAGNOSIS — Z23 Encounter for immunization: Secondary | ICD-10-CM | POA: Diagnosis not present

## 2018-05-02 DIAGNOSIS — F32A Depression, unspecified: Secondary | ICD-10-CM

## 2018-05-02 DIAGNOSIS — F329 Major depressive disorder, single episode, unspecified: Secondary | ICD-10-CM

## 2018-05-02 DIAGNOSIS — N200 Calculus of kidney: Secondary | ICD-10-CM | POA: Diagnosis not present

## 2018-05-02 DIAGNOSIS — R911 Solitary pulmonary nodule: Secondary | ICD-10-CM

## 2018-05-02 DIAGNOSIS — E538 Deficiency of other specified B group vitamins: Secondary | ICD-10-CM | POA: Diagnosis not present

## 2018-05-02 MED ORDER — AMLODIPINE BESYLATE 5 MG PO TABS: 5.0000 mg | ORAL_TABLET | Freq: Every day | ORAL | 1 refills | Status: DC

## 2018-05-02 MED ORDER — CYANOCOBALAMIN 1000 MCG/ML IJ SOLN: INTRAMUSCULAR | 1 refills | Status: AC

## 2018-05-02 MED ORDER — SERTRALINE HCL 50 MG PO TABS: ORAL_TABLET | ORAL | 1 refills | Status: DC

## 2018-05-02 MED ORDER — CYANOCOBALAMIN 1000 MCG/ML IJ SOLN
1000.0000 ug | Freq: Once | INTRAMUSCULAR | Status: AC
  Administered 2018-05-02: 1000 ug via INTRAMUSCULAR

## 2018-05-02 MED ORDER — TRAMADOL HCL 50 MG PO TABS: 50.0000 mg | ORAL_TABLET | Freq: Three times a day (TID) | ORAL | 0 refills | Status: DC | PRN

## 2018-05-02 NOTE — Patient Instructions (Signed)
Recommend B12 injection once a week for 4 weeks and then can go to once a month and then we can recheck her level.

## 2018-05-02 NOTE — Progress Notes (Signed)
Subjective:    Patient ID: Casey Fletcher, male    DOB: 08-10-1975, 42 y.o.   MRN: [id number]  HPI 42 year old male comes in today for recent emergency department visit.  He was seen at the ED at [facility] on April 28, 2018 complaining of right lower back pain that radiated around to his abdomen he was also expensing nausea at the time.  So had some pain with urination.  Urinalysis did confirm a large amount of blood.  Had a CT abdomen pelvis with contrast.  It showed a 3 x 4 mm calculus in the proximal right ureter with minimal upstream fullness of the right ureter and collecting system.  He also had some minimal right perinephric fat stranding favored to be reactive.  He was also incidentally noted to have a 4 mm solid pulmonary nodule within the inferior left upper lobe/lingula favored to be postinfectious/inflammatory but they did recommend that he has a tobacco use history to consider a CT of the chest in 12 months to document stability as per 2017 Fleischner guidelines.  He also had a punctate calcification along the anterior superior wall of the bladder which was felt to be nonspecific.  He also had vas deferens calcifications often seen with diabetes.  He was given hydrocodone and Zofran for the nausea he was experiencing.  Also recently completed lab work.  He has had a prior history of B12 deficiency and we rechecked his levels.  They were low at 178 so we encouraged him to come back and restart his B12 injections.\  Follow-up acute depression-he quit taking the Lexapro.  He says it was causing some abdominal pain and so he tapered himself off.  He says he is interested in maybe trying something different particularly to help with irritability.  Review of Systems   BP 128/89   Pulse 70   Temp 98 F (36.7 C) (Oral)   Wt (!) 311 lb (141.1 kg)   BMI 37.86 kg/m     Allergies  Allergen Reactions  . Haemophilus Influenzae     Other reaction(s): Other (See Comments) Bell's  Palsy  . Influenza Vac Split [Flu Virus Vaccine] Other (See Comments)    Bell's Palsy  . Lexapro [Escitalopram Oxalate] Nausea Only    Past Medical History:  Diagnosis Date  . Abdominal pain   . Anemia   . Asthma   . Blood in urine   . Blood transfusion   . Chills   . Cough   . Depression   . Diabetes mellitus   . Fainting   . Fatigue   . GERD (gastroesophageal reflux disease)   . Hypertension   . Hypothyroidism   . Jaundice   . Obesity   . Renal insufficiency   . Shortness of breath   . Vitamin deficiency   . Weakness generalized     Past Surgical History:  Procedure Laterality Date  . APPENDECTOMY  1988  . CARDIAC CATHETERIZATION  2005  . CHOLECYSTECTOMY  05/08/11  . ESOPHAGOGASTRODUODENOSCOPY  08/07/2011   Procedure: ESOPHAGOGASTRODUODENOSCOPY (EGD);  Surgeon: Kandis Cocking, MD;  Location: [facility];  Service: Endoscopy;  Laterality: N/A;  . ROUX-EN-Y GASTRIC BYPASS  1-12   Dr Johna Sheriff    Social History   Socioeconomic History  . Marital status: Married    Spouse name: Not on file  . Number of children: Not on file  . Years of education: Not on file  . Highest education level: Not on file  Occupational History  .  Not on file  Social Needs  . Financial resource strain: Not on file  . Food insecurity:    Worry: Not on file    Inability: Not on file  . Transportation needs:    Medical: Not on file    Non-medical: Not on file  Tobacco Use  . Smoking status: Never Smoker  . Smokeless tobacco: Never Used  Substance and Sexual Activity  . Alcohol use: No  . Drug use: No  . Sexual activity: Not on file  Lifestyle  . Physical activity:    Days per week: Not on file    Minutes per session: Not on file  . Stress: Not on file  Relationships  . Social connections:    Talks on phone: Not on file    Gets together: Not on file    Attends religious service: Not on file    Active member of club or organization: Not on file    Attends meetings of clubs  or organizations: Not on file    Relationship status: Not on file  . Intimate partner violence:    Fear of current or ex partner: Not on file    Emotionally abused: Not on file    Physically abused: Not on file    Forced sexual activity: Not on file  Other Topics Concern  . Not on file  Social History Narrative  . Not on file    Family History  Problem Relation Age of Onset  . Hyperlipidemia Mother   . Hypertension Mother   . Hyperlipidemia Father   . Hypertension Father     Outpatient Encounter Medications as of 05/02/2018  Medication Sig  . amLODipine (NORVASC) 5 MG tablet Take 1 tablet (5 mg total) by mouth daily.  . eszopiclone (LUNESTA) 2 MG TABS tablet TAKE 1 TABLET BY MOUTH IMMEDIATELY BEFORE BEDTIME  . HYDROcodone-acetaminophen (NORCO/VICODIN) 5-325 MG tablet Take by mouth.  . ondansetron (ZOFRAN) 4 MG tablet Take by mouth.  . [DISCONTINUED] amLODipine (NORVASC) 5 MG tablet Take 1 tablet (5 mg total) by mouth daily. Must schedule OV for more refills  . cyanocobalamin (,VITAMIN B-12,) 1000 MCG/ML injection Inject 1 mL (1,000 mcg total) into the muscle every 7 (seven) days for 28 days, THEN 1 mL (1,000 mcg total) every 30 (thirty) days for 28 days.  Marland Kitchen sertraline (ZOLOFT) 50 MG tablet 1/2 tab po QD x 8 days, the increase to whole tab QD  . traMADol (ULTRAM) 50 MG tablet Take 1-2 tablets (50-100 mg total) by mouth every 8 (eight) hours as needed.  . [DISCONTINUED] buPROPion (WELLBUTRIN XL) 150 MG 24 hr tablet Take 1 tablet (150 mg total) by mouth every morning. Must schedule OV for more refills (Patient not taking: Reported on 05/02/2018)  . [DISCONTINUED] escitalopram (LEXAPRO) 10 MG tablet Take 1 tablet (10 mg total) by mouth daily. LAST REFILL.YOU MUST SCHEDULE AND KEEP APPOINTMENT FOR REFILLS (Patient not taking: Reported on 05/02/2018)  . [EXPIRED] cyanocobalamin ((VITAMIN B-12)) injection 1,000 mcg    No facility-administered encounter medications on file as of  05/02/2018.           Objective:   Physical Exam  Constitutional: He is oriented to person, place, and time. He appears well-developed and well-nourished.  HENT:  Head: Normocephalic and atraumatic.  Cardiovascular: Normal rate, regular rhythm and normal heart sounds.  Pulmonary/Chest: Effort normal and breath sounds normal.  Musculoskeletal:  Mild CVA tenderness on the right.  Neurological: He is alert and oriented to person, place, and  time.  Skin: Skin is warm and dry.  Psychiatric: He has a normal mood and affect. His behavior is normal.        Assessment & Plan:   Right sided kidney stone-currently still has not passed the kidney stone.  We will give him some tramadol to use for pain.  He has Norco at home and has only taken 2 of them but wanted something not quite as strong.  Also go ahead and refer him to urology just in case he does not pass the stone between now on them as it will likely probably take about a week or so to get him a new appointment he prefers the [place] area if possible.  4 mm solid pulmonary pulmonary nodule in the inferior left upper lobe-discussed this.  He has never been a smoker and so is overall low risk so no further work-up is needed at this time.  Acute depression-we will try sertraline instead we will have him start with a half a tab and then go up to a whole tab after 1 week.  Will up in 6 to 8 weeks for mood.  B12 deficiency - due for B12 injection today.

## 2018-05-21 ENCOUNTER — Other Ambulatory Visit: Payer: Self-pay

## 2018-05-21 ENCOUNTER — Encounter: Payer: Self-pay | Admitting: Vascular Surgery

## 2018-05-21 ENCOUNTER — Ambulatory Visit (INDEPENDENT_AMBULATORY_CARE_PROVIDER_SITE_OTHER): Payer: PRIVATE HEALTH INSURANCE | Admitting: Vascular Surgery

## 2018-05-21 VITALS — BP 128/87 | HR 72 | Resp 18 | Ht 76.0 in | Wt 311.5 lb

## 2018-05-21 DIAGNOSIS — I83812 Varicose veins of left lower extremities with pain: Secondary | ICD-10-CM | POA: Diagnosis not present

## 2018-05-21 NOTE — Progress Notes (Signed)
Patient is a 42 year old male who returns for follow-up today.  He was last seen August 2019.  At that point he was having significant pain over a varicose vein in his left leg.  The vein has been present for several years.  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  He fell and hit his left knee during the time of the incident and since then the vein has been irritated and painful to him.  The varicosity is over his left knee and this is the primary area of pain.  He was recently seen by Dr. Thomasena Edis from orthopedics who did not think there was an orthopedic component to his pain.  He states that working does flareup the pain over his knee sometimes.  He has missed work secondary to this.  He has been compliant wearing his compression stockings.  However, he states he still has pain from a varicosity across his left knee.  He has been wearing his compression stockings for more than 3 months.  Chronic medical problems remain asthma diabetes hypertension all of which are currently stable.  Review of systems: He has no shortness of breath.  He has no chest pain.  He has returned to work full-time since the last time I saw him.  Social History   Socioeconomic History  . Marital status: Married    Spouse name: Not on file  . Number of children: Not on file  . Years of education: Not on file  . Highest education level: Not on file  Occupational History  . Not on file  Social Needs  . Financial resource strain: Not on file  . Food insecurity:    Worry: Not on file    Inability: Not on file  . Transportation needs:    Medical: Not on file    Non-medical: Not on file  Tobacco Use  . Smoking status: Never Smoker  . Smokeless tobacco: Never Used  Substance and Sexual Activity  . Alcohol use: No  . Drug use: No  . Sexual activity: Not on file  Lifestyle  . Physical activity:    Days per week: Not on file   Minutes per session: Not on file  . Stress: Not on file  Relationships  . Social connections:    Talks on phone: Not on file    Gets together: Not on file    Attends religious service: Not on file    Active member of club or organization: Not on file    Attends meetings of clubs or organizations: Not on file    Relationship status: Not on file  . Intimate partner violence:    Fear of current or ex partner: Not on file    Emotionally abused: Not on file    Physically abused: Not on file    Forced sexual activity: Not on file  Other Topics Concern  . Not on file  Social History Narrative  . Not on file    Family history: He has a family history of varicose veins in his mother.  Physical exam:  Vitals:   05/21/18 1306  BP: 128/87  Pulse: 72  Resp: 18  SpO2: 98%  Weight: (!) 311 lb 8 oz (141.3 kg)  Height: 6\' 4"  (1.93 m)    Lower extremities: Large cluster of varicosities over the dorsal aspect of his left knee     Data: I again reviewed his duplex ultrasound  from August.  This shows a very small saphenous vein about 2 mm diameter.  He did have reflux in the deep system as well as the saphenofemoral junction.  Assessment: Painful varicose veins left knee which is reducing his ability to work.  Plan: I discussed with the patient today that his weight or saphenous vein is too small to consider laser ablation.  However, I do believe he would benefit from having stab avulsions of the varicosity across the anterior aspect of his left knee.  All of this was discussed with his Workmen's Comp. representative present.  We will consider scheduling stab avulsions of this varicosity ending approval from his insurance.  Fabienne Bruns, MD [facility] Office: [phone] Pager: [phone]

## 2018-06-23 ENCOUNTER — Other Ambulatory Visit: Payer: Self-pay | Admitting: Family Medicine

## 2018-07-08 ENCOUNTER — Other Ambulatory Visit: Payer: Self-pay | Admitting: Family Medicine

## 2018-07-23 ENCOUNTER — Other Ambulatory Visit: Payer: Self-pay | Admitting: Family Medicine

## 2018-07-23 ENCOUNTER — Other Ambulatory Visit: Payer: Self-pay | Admitting: *Deleted

## 2018-08-06 ENCOUNTER — Encounter: Payer: Self-pay | Admitting: Vascular Surgery

## 2018-08-06 ENCOUNTER — Ambulatory Visit (INDEPENDENT_AMBULATORY_CARE_PROVIDER_SITE_OTHER): Payer: BC Managed Care – PPO | Admitting: Vascular Surgery

## 2018-08-06 VITALS — BP 118/72 | HR 66 | Temp 97.0°F | Resp 16 | Ht 75.0 in | Wt 289.0 lb

## 2018-08-06 DIAGNOSIS — I83812 Varicose veins of left lower extremities with pain: Secondary | ICD-10-CM | POA: Diagnosis not present

## 2018-08-06 HISTORY — PX: OTHER SURGICAL HISTORY: SHX169

## 2018-08-06 NOTE — Progress Notes (Signed)
    Stab Phlebectomy Procedure  Liev Correa-Vega DOB:1976-03-23  08/06/2018  Consent signed: Yes  Surgeon:C.E. Fields, MD  Procedure: stab phlebectomy: left leg  BP 118/72 (BP Location: Left Arm, Patient Position: Sitting, Cuff Size: Large)   Pulse 66   Temp (!) 97 F (36.1 C) (Oral)   Resp 16   Ht 6\' 3"  (1.905 m)   Wt 289 lb (131.1 kg)   SpO2 99%   BMI 36.12 kg/m   Start time: 9:15AM    End time: 10:15 AM    Tumescent Anesthesia: 200 cc 0.9% NaCl with 50 cc Lidocaine HCL 1%  and 15 cc 8.4% NaHCO3  Local Anesthesia: 6 cc Lidocaine HCL and NaHCO3 (ratio 2:1)    Stab Phlebectomy: > 20 Sites: Thigh and Calf  Patient tolerated procedure well: Yes    Description of Procedure:  After marking the course of the secondary varicosities, the patient was placed on the operating table in the supine position, and the left leg was prepped and draped in sterile fashion.    The patient was then put into Trendelenburg position.  Local anesthetic was administered at the previously marked varicosities, and tumescent anesthesia was administered around the vessels.  Greater than 20 stab wounds were made using the tip of an 11 blade. And using the vein hook, the phlebectomies were performed using a hemostat to avulse the varicosities.  Adequate hemostasis was achieved, and steri strips were applied to the stab wound.    .  ABD pads and thigh high compression stockings were applied as well ace wraps where needed. Blood loss was less than 15 cc.  The patient ambulated out of the operating room having tolerated the procedure well.  Fabienne Bruns, MD [facility] [place] Office: [phone] Pager: [phone]

## 2018-08-12 ENCOUNTER — Encounter: Payer: Self-pay | Admitting: Vascular Surgery

## 2018-08-20 ENCOUNTER — Ambulatory Visit: Payer: Self-pay | Admitting: Vascular Surgery

## 2018-08-22 ENCOUNTER — Ambulatory Visit (INDEPENDENT_AMBULATORY_CARE_PROVIDER_SITE_OTHER): Payer: PRIVATE HEALTH INSURANCE | Admitting: Vascular Surgery

## 2018-08-22 VITALS — BP 145/86 | HR 78 | Temp 97.5°F | Resp 18 | Ht 75.0 in

## 2018-08-22 DIAGNOSIS — I83812 Varicose veins of left lower extremities with pain: Secondary | ICD-10-CM

## 2018-08-22 NOTE — Progress Notes (Signed)
Patient is a 43 year old male who returns for follow-up today.  He returns after multiple stab avulsions approximately 20-30 over the left knee area.  He did have to miss work from January 27 to February 5 due to postoperative pain.  He has now returned to work.  His symptoms are greatly improved.  I believe at this point he is at maximal medical improvement.  He has 0% impairment.  All of his incisions are well-healed.  He has no significant drainage or redness.  Physical exam:  Vitals:   08/22/18 1453  BP: (!) 145/86  Pulse: 78  Resp: 18  Temp: (!) 97.5 F (36.4 C)  TempSrc: Oral  SpO2: 95%  Height: 6\' 3"  (1.905 m)    Left knee: Multiple stab incisions all well approximated healing no erythema no drainage  [place]: Doing well status post stab avulsions of multiple varicosities left lower extremity primarily around the left knee area.  Plan: The patient will continue to wear his compression stockings to prevent recurrent episodes in the future.  He will follow-up with [place] on an as-needed basis.  Fabienne Bruns, MD [facility] [place] Office: [phone] Pager: [phone]

## 2018-08-25 ENCOUNTER — Other Ambulatory Visit: Payer: Self-pay | Admitting: Family Medicine

## 2018-08-25 DIAGNOSIS — G4700 Insomnia, unspecified: Secondary | ICD-10-CM

## 2018-10-15 ENCOUNTER — Other Ambulatory Visit: Payer: Self-pay | Admitting: Family Medicine

## 2018-10-16 ENCOUNTER — Encounter: Payer: Self-pay | Admitting: Vascular Surgery

## 2018-12-20 ENCOUNTER — Other Ambulatory Visit: Payer: Self-pay | Admitting: Family Medicine

## 2018-12-20 DIAGNOSIS — I10 Essential (primary) hypertension: Secondary | ICD-10-CM

## 2019-03-20 ENCOUNTER — Other Ambulatory Visit: Payer: Self-pay | Admitting: Family Medicine

## 2019-03-20 DIAGNOSIS — I10 Essential (primary) hypertension: Secondary | ICD-10-CM

## 2020-09-23 ENCOUNTER — Ambulatory Visit (INDEPENDENT_AMBULATORY_CARE_PROVIDER_SITE_OTHER): Payer: BC Managed Care – PPO

## 2020-09-23 ENCOUNTER — Ambulatory Visit (INDEPENDENT_AMBULATORY_CARE_PROVIDER_SITE_OTHER): Payer: BC Managed Care – PPO | Admitting: Sports Medicine

## 2020-09-23 ENCOUNTER — Other Ambulatory Visit: Payer: Self-pay

## 2020-09-23 DIAGNOSIS — M722 Plantar fascial fibromatosis: Secondary | ICD-10-CM | POA: Diagnosis not present

## 2020-09-23 MED ORDER — HYDROCODONE-ACETAMINOPHEN 5-325 MG PO TABS: 1.0000 | ORAL_TABLET | Freq: Three times a day (TID) | ORAL | 0 refills | Status: DC | PRN

## 2020-09-23 NOTE — Progress Notes (Signed)
    Procedures performed today:    Procedure: Real-time Ultrasound Guided injection of the left plantar fascia origin Device: Samsung HS60  Verbal informed consent obtained.  Time-out conducted.  Noted no overlying erythema, induration, or other signs of local infection.  Skin prepped in a sterile fashion.  Local anesthesia: Topical Ethyl chloride.  With sterile technique and under real time ultrasound guidance:  Noted thickened plantar fascia, 1 cc Kenalog 40, 1 cc lidocaine, 1 cc bupivacaine injected easily Completed without difficulty  Advised to call if fevers/chills, erythema, induration, drainage, or persistent bleeding.  Images permanently stored and available for review in PACS.  Impression: Technically successful ultrasound guided injection.  Procedure: Real-time Ultrasound Guided injection of the right plantar fascia origin Device: Samsung HS60  Verbal informed consent obtained.  Time-out conducted.  Noted no overlying erythema, induration, or other signs of local infection.  Skin prepped in a sterile fashion.  Local anesthesia: Topical Ethyl chloride.  With sterile technique and under real time ultrasound guidance:  Noted thickened plantar fascia, 1 cc Kenalog 40, 1 cc lidocaine, 1 cc bupivacaine injected easily Completed without difficulty  Advised to call if fevers/chills, erythema, induration, drainage, or persistent bleeding.  Images permanently stored and available for review in PACS.  Impression: Technically successful ultrasound guided injection.  Independent interpretation of notes and tests performed by another provider:   None.  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 Jordan Likes for custom molded orthotic, home rehab exercises given, return to see me as needed in a month to 6 weeks.    ___________________________________________ Casey Fletcher. Casey Fletcher, M.D., ABFM., CAQSM. Primary Care and Sports Medicine [facility] [place]  Adjunct Instructor of Family Medicine  [employer]

## 2020-09-23 NOTE — Assessment & Plan Note (Addendum)
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 Jordan Likes for custom molded orthotic, home rehab exercises given, return to see me as needed in a month to 6 weeks. Hydrocodone for postprocedural pain per patient request.

## 2022-03-09 ENCOUNTER — Telehealth: Payer: Self-pay | Admitting: General Practice

## 2022-03-09 NOTE — Telephone Encounter (Signed)
Transition Care Management Follow-up Telephone Call Date of discharge and from where: 03/07/22 from [facility] How have you been since you were released from the hospital? Doing much better.  Any questions or concerns? No  Items Reviewed: Did the pt receive and understand the discharge instructions provided? Yes  Medications obtained and verified? Yes  Other? No  Any new allergies since your discharge? No  Dietary orders reviewed? Yes Do you have support at home? Yes   Home Care and Equipment/Supplies: Were home health services ordered? no  Functional Questionnaire: (I = Independent and D = Dependent) ADLs: I  Bathing/Dressing- I  Meal Prep- I  Eating- I  Maintaining continence- I  Transferring/Ambulation- I  Managing Meds- I  Follow up appointments reviewed:  PCP Hospital f/u appt confirmed? Yes  Scheduled to see Dr. Linford Arnold on 03/20/22 @ 1010. Specialist Hospital f/u appt confirmed? No   Are transportation arrangements needed? No  If their condition worsens, is the pt aware to call PCP or go to the Emergency Dept.? Yes Was the patient provided with contact information for the PCP's office or ED? Yes Was to pt encouraged to call back with questions or concerns? Yes

## 2022-03-20 ENCOUNTER — Inpatient Hospital Stay: Payer: BC Managed Care – PPO | Admitting: Family Medicine

## 2022-03-20 NOTE — Progress Notes (Deleted)
   Established Patient Office Visit  Subjective   Patient ID: Casey Fletcher, male    DOB: March 03, 1976  Age: 46 y.o. MRN: [id number]  No chief complaint on file.   HPI  Seen in ED on 03/07/22  for cough and SOB at [facility].  He had actually been seen multiple times prior to the ED visit and went there for persistent cough.  He had already been given inhaler, steroids, and antibiotics.  After being seen in the ED they diagnosed with a bronchitis.  And gave him steroids and DuoNeb.  Labs are reassuring.  A1c was 7.1.  Hypertension- Pt denies chest pain, SOB, dizziness, or heart palpitations.  Taking meds as directed w/o problems.  Denies medication side effects.     {History (Optional):23778}  ROS    Objective:     There were no vitals taken for this visit. {Vitals History (Optional):23777}  Physical Exam   No results found for any visits on 03/20/22.  {Labs (Optional):23779}  The 10-year ASCVD risk score (Arnett DK, et al., 2019) is: 4.8%    Assessment & Plan:   Problem List Items Addressed This Visit       Cardiovascular and Mediastinum   HYPERTENSION, BENIGN ESSENTIAL - Primary   Relevant Medications   olmesartan (BENICAR) 40 MG tablet   atorvastatin (LIPITOR) 40 MG tablet    No follow-ups on file.    Nani Gasser, MD

## 2024-04-16 LAB — HM COLONOSCOPY
